# Patient Record
Sex: Male | Born: 1980
Health system: Southern US, Community
[De-identification: ages and names within clinical notes are randomized; demographics above are authoritative.]

## PROBLEM LIST (undated history)

## (undated) DIAGNOSIS — I10 Essential (primary) hypertension: Secondary | ICD-10-CM

## (undated) DIAGNOSIS — R030 Elevated blood-pressure reading, without diagnosis of hypertension: Secondary | ICD-10-CM

## (undated) DIAGNOSIS — E785 Hyperlipidemia, unspecified: Secondary | ICD-10-CM

## (undated) DIAGNOSIS — E782 Mixed hyperlipidemia: Secondary | ICD-10-CM

## (undated) DIAGNOSIS — G4733 Obstructive sleep apnea (adult) (pediatric): Secondary | ICD-10-CM

## (undated) DIAGNOSIS — F419 Anxiety disorder, unspecified: Secondary | ICD-10-CM

## (undated) DIAGNOSIS — F439 Reaction to severe stress, unspecified: Secondary | ICD-10-CM

## (undated) HISTORY — DX: Mixed hyperlipidemia: E78.2

## (undated) HISTORY — DX: Obstructive sleep apnea (adult) (pediatric): G47.33

## (undated) HISTORY — DX: Hyperlipidemia, unspecified: E78.5

## (undated) HISTORY — DX: Elevated blood-pressure reading, without diagnosis of hypertension: R03.0

## (undated) HISTORY — DX: Reaction to severe stress, unspecified: F43.9

## (undated) HISTORY — DX: Anxiety disorder, unspecified: F41.9

---

## 2016-08-20 ENCOUNTER — Other Ambulatory Visit: Payer: Self-pay | Admitting: Family Medicine

## 2016-08-20 ENCOUNTER — Ambulatory Visit (INDEPENDENT_AMBULATORY_CARE_PROVIDER_SITE_OTHER): Payer: BLUE CROSS/BLUE SHIELD | Admitting: Family Medicine

## 2016-08-20 ENCOUNTER — Encounter: Payer: Self-pay | Admitting: Family Medicine

## 2016-08-20 VITALS — BP 120/80 | HR 85 | Ht 67.0 in | Wt 227.4 lb

## 2016-08-20 DIAGNOSIS — Z113 Encounter for screening for infections with a predominantly sexual mode of transmission: Secondary | ICD-10-CM

## 2016-08-20 DIAGNOSIS — Z13220 Encounter for screening for lipoid disorders: Secondary | ICD-10-CM

## 2016-08-20 DIAGNOSIS — E669 Obesity, unspecified: Secondary | ICD-10-CM

## 2016-08-20 DIAGNOSIS — R5383 Other fatigue: Secondary | ICD-10-CM

## 2016-08-20 DIAGNOSIS — E782 Mixed hyperlipidemia: Secondary | ICD-10-CM | POA: Diagnosis not present

## 2016-08-20 DIAGNOSIS — Z0000 Encounter for general adult medical examination without abnormal findings: Secondary | ICD-10-CM | POA: Diagnosis not present

## 2016-08-20 DIAGNOSIS — G479 Sleep disorder, unspecified: Secondary | ICD-10-CM | POA: Diagnosis not present

## 2016-08-20 DIAGNOSIS — R7989 Other specified abnormal findings of blood chemistry: Secondary | ICD-10-CM | POA: Diagnosis not present

## 2016-08-20 LAB — COMPREHENSIVE METABOLIC PANEL
ALBUMIN: 4.7 g/dL (ref 3.6–5.1)
ALT: 54 U/L — ABNORMAL HIGH (ref 9–46)
AST: 38 U/L (ref 10–40)
Alkaline Phosphatase: 78 U/L (ref 40–115)
BUN: 17 mg/dL (ref 7–25)
CALCIUM: 9.4 mg/dL (ref 8.6–10.3)
CHLORIDE: 102 mmol/L (ref 98–110)
CO2: 22 mmol/L (ref 20–31)
Creat: 0.96 mg/dL (ref 0.60–1.35)
Glucose, Bld: 85 mg/dL (ref 65–99)
POTASSIUM: 4.3 mmol/L (ref 3.5–5.3)
Sodium: 137 mmol/L (ref 135–146)
TOTAL PROTEIN: 7.4 g/dL (ref 6.1–8.1)
Total Bilirubin: 0.8 mg/dL (ref 0.2–1.2)

## 2016-08-20 LAB — CBC WITH DIFFERENTIAL/PLATELET
BASOS ABS: 63 {cells}/uL (ref 0–200)
Basophils Relative: 1 %
EOS ABS: 63 {cells}/uL (ref 15–500)
EOS PCT: 1 %
HEMATOCRIT: 47 % (ref 38.5–50.0)
HEMOGLOBIN: 16.3 g/dL (ref 13.2–17.1)
LYMPHS ABS: 1638 {cells}/uL (ref 850–3900)
Lymphocytes Relative: 26 %
MCH: 30.9 pg (ref 27.0–33.0)
MCHC: 34.7 g/dL (ref 32.0–36.0)
MCV: 89 fL (ref 80.0–100.0)
MONO ABS: 441 {cells}/uL (ref 200–950)
MPV: 11.3 fL (ref 7.5–12.5)
Monocytes Relative: 7 %
NEUTROS PCT: 65 %
Neutro Abs: 4095 cells/uL (ref 1500–7800)
Platelets: 210 10*3/uL (ref 140–400)
RBC: 5.28 MIL/uL (ref 4.20–5.80)
RDW: 13.1 % (ref 11.0–15.0)
WBC: 6.3 10*3/uL (ref 4.0–10.5)

## 2016-08-20 LAB — POCT URINALYSIS DIPSTICK
Bilirubin, UA: NEGATIVE
Blood, UA: NEGATIVE
GLUCOSE UA: NEGATIVE
Ketones, UA: NEGATIVE
Leukocytes, UA: NEGATIVE
NITRITE UA: NEGATIVE
PROTEIN UA: NEGATIVE
Spec Grav, UA: >=1.03
UROBILINOGEN UA: NEGATIVE
pH, UA: 6

## 2016-08-20 LAB — LIPID PANEL
CHOL/HDL RATIO: 6.1 ratio — AB (ref <=5.0)
CHOLESTEROL: 219 mg/dL — AB (ref 125–200)
HDL: 36 mg/dL — AB (ref >=40)
LDL Cholesterol: 129 mg/dL (ref <130)
Triglycerides: 268 mg/dL — ABNORMAL HIGH (ref <150)
VLDL: 54 mg/dL — ABNORMAL HIGH (ref <30)

## 2016-08-20 LAB — TSH: TSH: 1.44 m[IU]/L (ref 0.40–4.50)

## 2016-08-20 NOTE — Progress Notes (Signed)
Subjective:    Patient ID: Zachary Schultz, male    DOB: 20-Jul-1981, 35 y.o.   MRN: [id number]  HPI Chief Complaint  Patient presents with  . new pt    new pt- fasting cpe- owes a company so tired, trouble sleeping- always on the go.    He is new to the practice and here for a complete physical exam. Previous medical care: Zachary Schultz in [place] for past 11-12 years. States they no longer take his insurance.  Last CPE: 3-4 years ago.  Complains today of fatigue and waking up at night gasping for air. States he snores especially after drinking alcohol. States he falls asleep during the day sometimes while sitting in his car for lunch or sitting down to watch tv.  Other providers: none  Past medical history: anxiety in past and took medication. Took medication for this for a short period. Denies feeling anxious but does reports high stress job.   Surgeries: none  Family history: father died- unknown health history other than . Mother living and good health.   Social history: Lives with wife and child age 115. He is the owner of [employer].  Smokes "when I drink" , drinks alcohol about 1 time "heavily"  Per week, 12-18 beers, occasional marijuana use.  Diet: [employer] program- for past 2 weeks and lost 6 lbs.  Exercise: started exercising 2 months ago.   Immunizations: Tdap 4 years ago. Declines flu shot.   Health maintenance:  Colonoscopy: N/A Last PSA: n/a Last Dental Exam: annually Last Eye Exam: within past year.   Wears seatbelt always, uses sunscreen, smoke detectors in home and functioning, does not text while driving, feels safe in home environment.  Reviewed allergies, medications, past medical, surgical, family, and social history.   Review of Systems Review of Systems Constitutional: -fever, -chills, -sweats, -unexpected weight change,+fatigue ENT: -runny nose, -ear pain, -sore throat Cardiology:  -chest pain, -palpitations,  -edema Respiratory: -cough, -shortness of breath, -wheezing Gastroenterology: -abdominal pain, -nausea, -vomiting, -diarrhea, -constipation  Hematology: -bleeding or bruising problems Musculoskeletal: -arthralgias, -myalgias, -joint swelling, -back pain Ophthalmology: -vision changes Urology: -dysuria, -difficulty urinating, -hematuria, -urinary frequency, -urgency Neurology: -headache, -weakness, -tingling, -numbness       Objective:   Physical Exam BP 120/80   Pulse 85   Ht 5\' 7"  (1.702 m)   Wt 227 lb 6.4 oz (103.1 kg)   BMI 35.62 kg/m   General Appearance:    Alert, cooperative, no distress, appears stated age  Head:    Normocephalic, without obvious abnormality, atraumatic  Eyes:    PERRL, conjunctiva/corneas clear, EOM's intact, fundi    benign  Ears:    Normal TM's and external ear canals  Nose:   Nares normal, mucosa normal, no drainage or sinus   tenderness  Throat:   Lips, mucosa, and tongue normal; teeth and gums normal  Neck:   Supple, no lymphadenopathy;  thyroid:  no   enlargement/tenderness/nodules; no carotid   bruit or JVD  Back:    Spine nontender, no curvature, ROM normal, no CVA     tenderness  Lungs:     Clear to auscultation bilaterally without wheezes, rales or     ronchi; respirations unlabored  Chest Wall:    No tenderness or deformity   Heart:    Regular rate and rhythm, S1 and S2 normal, no murmur, rub   or gallop  Breast Exam:    No chest wall tenderness, masses or gynecomastia  Abdomen:  Soft, non-tender, nondistended, normoactive bowel sounds,    no masses, no hepatosplenomegaly  Genitalia:    declined.  Rectal:   Deferred due to age <40 and lack of symptoms  Extremities:   No clubbing, cyanosis or edema  Pulses:   2+ and symmetric all extremities  Skin:   Skin color, texture, turgor normal, no rashes or lesions  Lymph nodes:   Cervical, supraclavicular, and axillary nodes normal  Neurologic:   CNII-XII intact, normal strength, sensation and  gait; reflexes 2+ and symmetric throughout          Psych:   Normal mood, affect, hygiene and grooming.    Urinalysis dipstick: negative Epworth sleep study: 14     Assessment & Plan:  Routine general medical examination at a health care facility - Plan: CBC with Differential/Platelet, Comprehensive metabolic panel, POCT urinalysis dipstick, Lipid panel  Sleep disturbance - Plan: Home sleep test  Other fatigue - Plan: TSH, VITAMIN D 25 Hydroxy (Vit-D Deficiency, Fractures)  Obesity (BMI 30-39.9)  Screening for lipid disorders - Plan: Lipid panel  Screening for STD (sexually transmitted disease) - Plan: HIV antibody, RPR, GC/Chlamydia Probe Amp  Discussed that I would like to refer him for a sleep study for possible sleep apnea. Epworth scale speaks to this.  Suspect fatigue may be related to sleep apnea. Plan to check labs to rule out underlying etiology.  Congratulated him on exercising and making healthy dietary changes. Cautioned him about short-term fad diets and encouraged him to think about healthy lifestyle modifications that he can sustain.  STD screening done per patient request.  Discussed stress management such as taking slow deep breaths, exercise, avoiding alcohol and other substances.  Advised that binge drinking is harmful to his health.  Review of care everywhere does show that he has a history of hyperlipidemia and elevated triglycerides in 2014. Patient did not mention this today. his Plan to get previous medical records and have him follow up in 3 weeks for fatigue, sleep issues.

## 2016-08-20 NOTE — Patient Instructions (Signed)
The sleep lab will call you to schedule and appointment. You may have sleep apnea but they will be able to do more testing to see.   Follow up in 3 weeks but make sure I have your previous medical records.  We will call you with lab results.   Preventative Care for Adults, Male       REGULAR HEALTH EXAMS:  A routine yearly physical is a good way to check in with your primary care provider about your health and preventive screening. It is also an opportunity to share updates about your health and any concerns you have, and receive a thorough all-over exam.   Most health insurance companies pay for at least some preventative services.  Check with your health plan for specific coverages.  WHAT PREVENTATIVE SERVICES DO MEN NEED?  Adult men should have their weight and blood pressure checked regularly.   Men age 35 and older should have their cholesterol levels checked regularly.  Beginning at age 35 and continuing to age 35, men should be screened for colorectal cancer.  Certain people should may need continued testing until age 35.  Other cancer screening may include exams for testicular and prostate cancer.  Updating vaccinations is part of preventative care.  Vaccinations help protect against diseases such as the flu.  Lab tests are generally done as part of preventative care to screen for anemia and blood disorders, to screen for problems with the kidneys and liver, to screen for bladder problems, to check blood sugar, and to check your cholesterol level.  Preventative services generally include counseling about diet, exercise, avoiding tobacco, drugs, excessive alcohol consumption, and sexually transmitted infections.    GENERAL RECOMMENDATIONS FOR GOOD HEALTH:  Healthy diet:  Eat a variety of foods, including fruit, vegetables, animal or vegetable protein, such as meat, fish, chicken, and eggs, or beans, lentils, tofu, and grains, such as rice.  Drink plenty of water  daily.  Decrease saturated fat in the diet, avoid lots of red meat, processed foods, sweets, fast foods, and fried foods.  Exercise:  Aerobic exercise helps maintain good heart health. At least 30-40 minutes of moderate-intensity exercise is recommended. For example, a brisk walk that increases your heart rate and breathing. This should be done on most days of the week.   Find a type of exercise or a variety of exercises that you enjoy so that it becomes a part of your daily life.  Examples are running, walking, swimming, water aerobics, and biking.  For motivation and support, explore group exercise such as aerobic class, spin class, Zumba, Yoga,or  martial arts, etc.    Set exercise goals for yourself, such as a certain weight goal, walk or run in a race such as a 5k walk/run.  Speak to your primary care provider about exercise goals.  Disease prevention:  If you smoke or chew tobacco, find out from your caregiver how to quit. It can literally save your life, no matter how long you have been a tobacco user. If you do not use tobacco, never begin.   Maintain a healthy diet and normal weight. Increased weight leads to problems with blood pressure and diabetes.   The Body Mass Index or BMI is a way of measuring how much of your body is fat. Having a BMI above 27 increases the risk of heart disease, diabetes, hypertension, stroke and other problems related to obesity. Your caregiver can help determine your BMI and based on it develop an exercise and dietary  program to help you achieve or maintain this important measurement at a healthful level.  High blood pressure causes heart and blood vessel problems.  Persistent high blood pressure should be treated with medicine if weight loss and exercise do not work.   Fat and cholesterol leaves deposits in your arteries that can block them. This causes heart disease and vessel disease elsewhere in your body.  If your cholesterol is found to be high, or if  you have heart disease or certain other medical conditions, then you may need to have your cholesterol monitored frequently and be treated with medication.   Ask if you should have a stress test if your history suggests this. A stress test is a test done on a treadmill that looks for heart disease. This test can find disease prior to there being a problem.  Avoid drinking alcohol in excess (more than two drinks per day).  Avoid use of street drugs. Do not share needles with anyone. Ask for professional help if you need assistance or instructions on stopping the use of alcohol, cigarettes, and/or drugs.  Brush your teeth twice a day with fluoride toothpaste, and floss once a day. Good oral hygiene prevents tooth decay and gum disease. The problems can be painful, unattractive, and can cause other health problems. Visit your dentist for a routine oral and dental check up and preventive care every 6-12 months.   Look at your skin regularly.  Use a mirror to look at your back. Notify your caregivers of changes in moles, especially if there are changes in shapes, colors, a size larger than a pencil eraser, an irregular border, or development of new moles.  Safety:  Use seatbelts 100% of the time, whether driving or as a passenger.  Use safety devices such as hearing protection if you work in environments with loud noise or significant background noise.  Use safety glasses when doing any work that could send debris in to the eyes.  Use a helmet if you ride a bike or motorcycle.  Use appropriate safety gear for contact sports.  Talk to your caregiver about gun safety.  Use sunscreen with a SPF (or skin protection factor) of 15 or greater.  Lighter skinned people are at a greater risk of skin cancer. Don't forget to also wear sunglasses in order to protect your eyes from too much damaging sunlight. Damaging sunlight can accelerate cataract formation.   Practice safe sex. Use condoms. Condoms are used for  birth control and to help reduce the spread of sexually transmitted infections (or STIs).  Some of the STIs are gonorrhea (the clap), chlamydia, syphilis, trichomonas, herpes, HPV (human papilloma virus) and HIV (human immunodeficiency virus) which causes AIDS. The herpes, HIV and HPV are viral illnesses that have no cure. These can result in disability, cancer and death.   Keep carbon monoxide and smoke detectors in your home functioning at all times. Change the batteries every 6 months or use a model that plugs into the wall.   Vaccinations:  Stay up to date with your tetanus shots and other required immunizations. You should have a booster for tetanus every 10 years. Be sure to get your flu shot every year, since 5%-20% of the [place] population comes down with the flu. The flu vaccine changes each year, so being vaccinated once is not enough. Get your shot in the fall, before the flu season peaks.   Other vaccines to consider:  Pneumococcal vaccine to protect against certain types of pneumonia.  This is normally recommended for adults age 35 or older.  However, adults younger than 35 years old with certain underlying conditions such as diabetes, heart or lung disease should also receive the vaccine.  Shingles vaccine to protect against Varicella Zoster if you are older than age 35, or younger than 35 years old with certain underlying illness.  Hepatitis A vaccine to protect against a form of infection of the liver by a virus acquired from food.  Hepatitis B vaccine to protect against a form of infection of the liver by a virus acquired from blood or body fluids, particularly if you work in health care.  If you plan to travel internationally, check with your local health department for specific vaccination recommendations.  Cancer Screening:  Most routine colon cancer screening begins at the age of 35. On a yearly basis, doctors may provide special easy to use take-home tests to check for hidden  blood in the stool. Sigmoidoscopy or colonoscopy can detect the earliest forms of colon cancer and is life saving. These tests use a small camera at the end of a tube to directly examine the colon. Speak to your caregiver about this at age 35, when routine screening begins (and is repeated every 5 years unless early forms of pre-cancerous polyps or small growths are found).   At the age of 35 men usually start screening for prostate cancer every year. Screening may begin at a younger age for those with higher risk. Those at higher risk include African-Americans or having a family history of prostate cancer. There are two types of tests for prostate cancer:   Prostate-specific antigen (PSA) testing. Recent studies raise questions about prostate cancer using PSA and you should discuss this with your caregiver.   Digital rectal exam (in which your doctor's lubricated and gloved finger feels for enlargement of the prostate through the anus).   Screening for testicular cancer.  Do a monthly exam of your testicles. Gently roll each testicle between your thumb and fingers, feeling for any abnormal lumps. The best time to do this is after a hot shower or bath when the tissues are looser. Notify your caregivers of any lumps, tenderness or changes in size or shape immediately.

## 2016-08-21 LAB — GC/CHLAMYDIA PROBE AMP
CT Probe RNA: NOT DETECTED
GC Probe RNA: NOT DETECTED

## 2016-08-21 LAB — RPR

## 2016-08-21 LAB — VITAMIN D 25 HYDROXY (VIT D DEFICIENCY, FRACTURES): Vit D, 25-Hydroxy: 32 ng/mL (ref 30–100)

## 2016-08-21 LAB — HIV ANTIBODY (ROUTINE TESTING W REFLEX): HIV: NONREACTIVE

## 2016-08-22 LAB — HEPATITIS PANEL, ACUTE
HCV Ab: NEGATIVE
HEP A IGM: NONREACTIVE
HEP B S AG: NEGATIVE
Hep B C IgM: NONREACTIVE

## 2016-08-22 LAB — TESTOSTERONE: TESTOSTERONE: 541 ng/dL (ref 250–827)

## 2016-09-02 ENCOUNTER — Telehealth: Payer: Self-pay

## 2016-09-02 NOTE — Telephone Encounter (Signed)
Placed in your folder for review. 

## 2016-09-05 ENCOUNTER — Telehealth: Payer: Self-pay | Admitting: Family Medicine

## 2016-09-05 NOTE — Telephone Encounter (Signed)
Zachary RiegerLaura, Please call and check on his sleep study. He meets criteria as far as I'm concerned and the documentation is in my note regarding his symptoms. He wakes up at night gasping for air and his wife has observed him stop breathing, he snores, has fatigue during the day and falls asleep at random times when he sits down. His epworth sleep scale is 14. Please help me get this approved. Thanks.  Aralyn Nowak

## 2016-09-10 ENCOUNTER — Ambulatory Visit: Payer: BLUE CROSS/BLUE SHIELD | Admitting: Family Medicine

## 2016-09-12 ENCOUNTER — Encounter: Payer: Self-pay | Admitting: Family Medicine

## 2016-09-12 ENCOUNTER — Telehealth: Payer: Self-pay | Admitting: Family Medicine

## 2016-09-12 NOTE — Telephone Encounter (Signed)
Appeal letter typed & faxed for sleep study

## 2016-09-17 ENCOUNTER — Encounter: Payer: Self-pay | Admitting: Family Medicine

## 2016-09-17 ENCOUNTER — Ambulatory Visit (INDEPENDENT_AMBULATORY_CARE_PROVIDER_SITE_OTHER): Payer: BLUE CROSS/BLUE SHIELD | Admitting: Family Medicine

## 2016-09-17 DIAGNOSIS — E782 Mixed hyperlipidemia: Secondary | ICD-10-CM | POA: Diagnosis not present

## 2016-09-17 DIAGNOSIS — R740 Nonspecific elevation of levels of transaminase and lactic acid dehydrogenase [LDH]: Secondary | ICD-10-CM | POA: Diagnosis not present

## 2016-09-17 DIAGNOSIS — G479 Sleep disorder, unspecified: Secondary | ICD-10-CM | POA: Insufficient documentation

## 2016-09-17 DIAGNOSIS — R7401 Elevation of levels of liver transaminase levels: Secondary | ICD-10-CM

## 2016-09-17 NOTE — Progress Notes (Signed)
   Subjective:    Patient ID: Zachary Schultz, male    DOB: 1981-02-26, 35 y.o.   MRN: [id number]  HPI Chief Complaint  Patient presents with  . 2 week follow-up    2 week follow-up. feeling better- not sleeping well, stopped drinking alot, and smoking   He is here for follow up on sleep issues, fatigue, elevated liver enzymes and binge drinking. He also had abnormal cholesterol values at his previous visit.  States he has been watching his diet, exercising and has lost some weight. States he had a conference in [place] last week and went off his diet and drank alcohol but he is back to healthy lifestyle this week.  He is optimistic that he can make positive healthy lifestyle changes.  Sates he is doing cardio with a trainer, jogging 1.5 miles 3 days per week. Is doing weight training also. States he feels much better since making these changes. Has more energy.  He has also cut back on smoking since he is not drinking as often.   He is still concerned regarding sleep issues, he states he is full of energy and wants to slow down but has a hard time shutting his brain down at night. States he also wakes up in the middle of night thinking of things for work. States he has still been snoring but has not been waking up gasping for air or with his heart racing like he was in the past. He was not approved to have a sleep study and our office is still in the process of getting this approved as he does meet criteria for this.    He denies fever, chills, headache, dizziness, chest pain, palpitations, DOE, GI or GU symptoms.  Reviewed allergies, medications, past medical, and social history.  Past Medical History:  Diagnosis Date  . Elevated triglycerides with high cholesterol    2014  . Hyperlipidemia    Reviewed allergies, medications, past medical, and social history.   Review of Systems Pertinent positives and negatives in the history of present illness.     Objective:   Physical Exam BP  130/84   Pulse 80   Wt 223 lb 3.2 oz (101.2 kg)   BMI 34.96 kg/m   Alert and oriented and in no acute distress. Not otherwise examined.      Assessment & Plan:  Sleep disorder  Mixed hyperlipidemia - Plan: Lipid panel  Elevated ALT measurement - Plan: Comprehensive metabolic panel  He is pleasant, has a very positive outlook on life and is very energetic. Recommend that he try melatonin for now. Sleep study is in the process of being approved.  He will return prior to Thanksgiving for fasting lipids and CMP for elevated liver enzymes. Orders are in the computer.  Recommend that he follow-up regarding sleep once he has had a sleep study and let me know if the melatonin is helping. Congratulated him on making healthy lifestyle changes and encouraged him to continue with healthy diet and exercise. He has had a 4 pound weight loss and is pleased about this. He has also significantly reduced his alcohol intake. Advised him to stop drinking and smoking in order to prevent chronic health conditions such as liver disease, heart disease.  He may need to start on cholesterol treatment after we repeat his fasting lipids in November.

## 2016-09-17 NOTE — Patient Instructions (Signed)
Try taking Melatonin ([place] is a good brand). You can buy the rapid dissolving or the tablets. Start with 3 mg and see how this helps. Take this 20-30 minutes before you want to go to sleep.  Continue with healthy diet and exercise. Congratulations on making this lifestyle change.  Return at the end of November or early December for repeat fasting cholesterol and liver enzymes.

## 2016-10-14 ENCOUNTER — Encounter (HOSPITAL_BASED_OUTPATIENT_CLINIC_OR_DEPARTMENT_OTHER): Payer: Self-pay

## 2016-10-14 ENCOUNTER — Other Ambulatory Visit: Payer: BLUE CROSS/BLUE SHIELD

## 2016-11-26 ENCOUNTER — Encounter: Payer: Self-pay | Admitting: Medical

## 2016-11-26 ENCOUNTER — Other Ambulatory Visit: Payer: Self-pay | Admitting: Medical

## 2016-11-26 ENCOUNTER — Ambulatory Visit (INDEPENDENT_AMBULATORY_CARE_PROVIDER_SITE_OTHER): Payer: BLUE CROSS/BLUE SHIELD | Admitting: Medical

## 2016-11-26 VITALS — BP 144/80 | HR 91 | Wt 231.2 lb

## 2016-11-26 DIAGNOSIS — L918 Other hypertrophic disorders of the skin: Secondary | ICD-10-CM

## 2016-11-26 DIAGNOSIS — L989 Disorder of the skin and subcutaneous tissue, unspecified: Secondary | ICD-10-CM | POA: Diagnosis not present

## 2016-11-26 NOTE — Progress Notes (Signed)
Subjective: Chief Complaint  Patient presents with  . skin tag   Here for skin lesions he wants removed.  They are embarrassing, worried about them.   Has large one on left upper arm, but some in both axilla, several around the eye lids.   The left arm one has grown but the rest are unchanged, been there a while.   No other aggravating or relieving factors. No other complaint.  Past Medical History:  Diagnosis Date  . Elevated triglycerides with high cholesterol    2014  . Hyperlipidemia    No current outpatient prescriptions on file prior to visit.   No current facility-administered medications on file prior to visit.    ROS as in subjective   Objective BP (!) 144/80   Pulse 91   Wt 231 lb 3.2 oz (104.9 kg)   SpO2 97%   BMI 36.21 kg/m   Gen: wd, wn, nad Skin:  Lesion 1, 2, 3 - group of pedunculated flesh colored benign skin tags each about 3mm diameter of right axilla  Lesion 4 - single pedunculated 3mm diameter skin tag of left axilla  Lesion 5 - larger 4mm x 3mm fleshy but somewhat brown lesion of left upper arm, possible skin tag vs other  Lesion 6 - right face, below eyelid with 1mm diameter raised skin tag  Lesion 7 - right face, upper eyelid with 1mm diameter raised skin tag  Lesion 8 - left face, inferior to eyelid with 2mm diameter raised skin tag    Assessment: Encounter Diagnoses  Name Primary?  . Changing skin lesion Yes  . Skin tag     Plan: Discussed options for therapy, and he wishes to have all 8 lesions removed/excised today.   discussed risks/benefits of procedure, after care, benign nature of the skin tags, the one lesion will be sent to pathology.  Discussed possible insurance billing vs cash pay.  Cleaned and prepped the arm and axilla lesions in usual sterile fashion.  Used ethyl acetate spray for local anesthesia.  Removed lesions 1,2,3,4,5 with sterile scissors.  Lesions were benign skin lesions except the left anterior upper arm lesion  (lesion 5), sent for pathology.  Cleaned and prepped the facial lesions/eye lid lesions in usual sterile fashion.  Used 1% xylocaine without epi for local anesthesia.  Removed lesions forceps and scalpel carefully.   Lesions 6,7,8.  Lesions were benign skin lesions.   Achieved hemostasis with direct pressure.

## 2016-11-26 NOTE — Patient Instructions (Signed)
Skin Tag, Adult A skin tag (acrochordon) is a soft, extra growth of skin. Most skin tags are flesh-colored and rarely bigger than a pencil eraser. They commonly form near areas where there are folds in the skin, such as the armpit or groin. Skin tags are not dangerous, and they do not spread from person to person (are not contagious). You may have one skin tag or several. Skin tags do not require treatment. However, your health care provider may recommend removal of a skin tag if it:  Gets irritated from clothing.  Bleeds.  Is visible and unsightly. Your health care provider can remove skin tags with a simple surgical procedure or a procedure that involves freezing the skin tag. Follow these instructions at home:  Watch for any changes in your skin tag. A normal skin tag does not require any other special care at home.  Take over-the-counter and prescription medicines only as told by your health care provider.  Keep all follow-up visits as told by your health care provider. This is important. Contact a health care provider if:  You have a skin tag that:  Becomes painful.  Changes color.  Bleeds.  Swells.  You develop more skin tags. This information is not intended to replace advice given to you by your health care provider. Make sure you discuss any questions you have with your health care provider. Document Released: 11/25/2015 Document Revised: 07/06/2016 Document Reviewed: 11/25/2015 [employer] Interactive Patient Education  2017 [employer]  

## 2018-04-22 ENCOUNTER — Encounter: Payer: Self-pay | Admitting: Family Medicine

## 2018-04-22 ENCOUNTER — Ambulatory Visit: Payer: BLUE CROSS/BLUE SHIELD | Admitting: Family Medicine

## 2018-04-22 VITALS — BP 136/94 | HR 84 | Temp 98.4°F | Resp 20 | Ht 66.5 in | Wt 239.8 lb

## 2018-04-22 DIAGNOSIS — R631 Polydipsia: Secondary | ICD-10-CM

## 2018-04-22 DIAGNOSIS — R0981 Nasal congestion: Secondary | ICD-10-CM

## 2018-04-22 DIAGNOSIS — R030 Elevated blood-pressure reading, without diagnosis of hypertension: Secondary | ICD-10-CM

## 2018-04-22 DIAGNOSIS — R740 Nonspecific elevation of levels of transaminase and lactic acid dehydrogenase [LDH]: Secondary | ICD-10-CM

## 2018-04-22 DIAGNOSIS — R5383 Other fatigue: Secondary | ICD-10-CM | POA: Diagnosis not present

## 2018-04-22 DIAGNOSIS — Z9189 Other specified personal risk factors, not elsewhere classified: Secondary | ICD-10-CM | POA: Insufficient documentation

## 2018-04-22 DIAGNOSIS — G4719 Other hypersomnia: Secondary | ICD-10-CM

## 2018-04-22 DIAGNOSIS — E669 Obesity, unspecified: Secondary | ICD-10-CM | POA: Insufficient documentation

## 2018-04-22 DIAGNOSIS — K219 Gastro-esophageal reflux disease without esophagitis: Secondary | ICD-10-CM | POA: Diagnosis not present

## 2018-04-22 DIAGNOSIS — E782 Mixed hyperlipidemia: Secondary | ICD-10-CM

## 2018-04-22 DIAGNOSIS — R7401 Elevation of levels of liver transaminase levels: Secondary | ICD-10-CM

## 2018-04-22 DIAGNOSIS — J3489 Other specified disorders of nose and nasal sinuses: Secondary | ICD-10-CM

## 2018-04-22 DIAGNOSIS — J309 Allergic rhinitis, unspecified: Secondary | ICD-10-CM | POA: Diagnosis not present

## 2018-04-22 DIAGNOSIS — Z833 Family history of diabetes mellitus: Secondary | ICD-10-CM | POA: Insufficient documentation

## 2018-04-22 HISTORY — DX: Elevated blood-pressure reading, without diagnosis of hypertension: R03.0

## 2018-04-22 LAB — POCT URINALYSIS DIP (PROADVANTAGE DEVICE)
BILIRUBIN UA: NEGATIVE mg/dL
Bilirubin, UA: NEGATIVE
GLUCOSE UA: NEGATIVE mg/dL
Leukocytes, UA: NEGATIVE
Nitrite, UA: NEGATIVE
PROTEIN UA: NEGATIVE mg/dL
RBC UA: NEGATIVE
Specific Gravity, Urine: 1.03
Urobilinogen, Ur: NEGATIVE
pH, UA: 6 (ref 5.0–8.0)

## 2018-04-22 LAB — POCT GLYCOSYLATED HEMOGLOBIN (HGB A1C): HEMOGLOBIN A1C: 5.7 % — AB (ref 4.0–5.6)

## 2018-04-22 NOTE — Patient Instructions (Addendum)
Your blood test shows that you have prediabetes. This increases your risk of developing diabetes. Cut back on sugar, sweets and carbohydrates such as bread, pasta, rice, and potatoes.   Your blood pressure is elevated today. I recommend that you get a blood pressure cuff and check this at home a few times per week. Goal BP is less than 130/80.  Cut back on salty foods.   You may want to try taking a non drowsy antihistamine such as Claritin, Allegra, Zyrtec or Xyzal to see if this helps with the left sided facial symptoms and nasal congestion.   I am ordering a sleep study to see if you have sleep apnea. They should contact you about this.   You may want to try taking an acid reducer for acid reflux such as Zantac. If this does not help then you can try omeprazole or pantoprazole.   Cut back on alcohol.    DASH Eating Plan DASH stands for "Dietary Approaches to Stop Hypertension." The DASH eating plan is a healthy eating plan that has been shown to reduce high blood pressure (hypertension). It may also reduce your risk for type 2 diabetes, heart disease, and stroke. The DASH eating plan may also help with weight loss. What are tips for following this plan? General guidelines  Avoid eating more than 2,300 mg (milligrams) of salt (sodium) a day. If you have hypertension, you may need to reduce your sodium intake to 1,500 mg a day.  Limit alcohol intake to no more than 1 drink a day for nonpregnant women and 2 drinks a day for men. One drink equals 12 oz of beer, 5 oz of wine, or 1 oz of hard liquor.  Work with your health care provider to maintain a healthy body weight or to lose weight. Ask what an ideal weight is for you.  Get at least 30 minutes of exercise that causes your heart to beat faster (aerobic exercise) most days of the week. Activities may include walking, swimming, or biking.  Work with your health care provider or diet and nutrition specialist (dietitian) to adjust your  eating plan to your individual calorie needs. Reading food labels  Check food labels for the amount of sodium per serving. Choose foods with less than 5 percent of the Daily Value of sodium. Generally, foods with less than 300 mg of sodium per serving fit into this eating plan.  To find whole grains, look for the word "whole" as the first word in the ingredient list. Shopping  Buy products labeled as "low-sodium" or "no salt added."  Buy fresh foods. Avoid canned foods and premade or frozen meals. Cooking  Avoid adding salt when cooking. Use salt-free seasonings or herbs instead of table salt or sea salt. Check with your health care provider or pharmacist before using salt substitutes.  Do not fry foods. Cook foods using healthy methods such as baking, boiling, grilling, and broiling instead.  Cook with heart-healthy oils, such as olive, canola, soybean, or sunflower oil. Meal planning   Eat a balanced diet that includes: ? 5 or more servings of fruits and vegetables each day. At each meal, try to fill half of your plate with fruits and vegetables. ? Up to 6-8 servings of whole grains each day. ? Less than 6 oz of lean meat, poultry, or fish each day. A 3-oz serving of meat is about the same size as a deck of cards. One egg equals 1 oz. ? 2 servings of low-fat dairy  each day. ? A serving of nuts, seeds, or beans 5 times each week. ? Heart-healthy fats. Healthy fats called Omega-3 fatty acids are found in foods such as flaxseeds and coldwater fish, like sardines, salmon, and mackerel.  Limit how much you eat of the following: ? Canned or prepackaged foods. ? Food that is high in trans fat, such as fried foods. ? Food that is high in saturated fat, such as fatty meat. ? Sweets, desserts, sugary drinks, and other foods with added sugar. ? Full-fat dairy products.  Do not salt foods before eating.  Try to eat at least 2 vegetarian meals each week.  Eat more home-cooked food and  less restaurant, buffet, and fast food.  When eating at a restaurant, ask that your food be prepared with less salt or no salt, if possible. What foods are recommended? The items listed may not be a complete list. Talk with your dietitian about what dietary choices are best for you. Grains Whole-grain or whole-wheat bread. Whole-grain or whole-wheat pasta. Brown rice. Orpah Cobb. Bulgur. Whole-grain and low-sodium cereals. Pita bread. Low-fat, low-sodium crackers. Whole-wheat flour tortillas. Vegetables Fresh or frozen vegetables (raw, steamed, roasted, or grilled). Low-sodium or reduced-sodium tomato and vegetable juice. Low-sodium or reduced-sodium tomato sauce and tomato paste. Low-sodium or reduced-sodium canned vegetables. Fruits All fresh, dried, or frozen fruit. Canned fruit in natural juice (without added sugar). Meat and other protein foods Skinless chicken or [place]. Ground chicken or [place]. Pork with fat trimmed off. Fish and seafood. Egg whites. Dried beans, peas, or lentils. Unsalted nuts, nut butters, and seeds. Unsalted canned beans. Lean cuts of beef with fat trimmed off. Low-sodium, lean deli meat. Dairy Low-fat (1%) or fat-free (skim) milk. Fat-free, low-fat, or reduced-fat cheeses. Nonfat, low-sodium ricotta or cottage cheese. Low-fat or nonfat yogurt. Low-fat, low-sodium cheese. Fats and oils Soft margarine without trans fats. Vegetable oil. Low-fat, reduced-fat, or light mayonnaise and salad dressings (reduced-sodium). Canola, safflower, olive, soybean, and sunflower oils. Avocado. Seasoning and other foods Herbs. Spices. Seasoning mixes without salt. Unsalted popcorn and pretzels. Fat-free sweets. What foods are not recommended? The items listed may not be a complete list. Talk with your dietitian about what dietary choices are best for you. Grains Baked goods made with fat, such as croissants, muffins, or some breads. Dry pasta or rice meal  packs. Vegetables Creamed or fried vegetables. Vegetables in a cheese sauce. Regular canned vegetables (not low-sodium or reduced-sodium). Regular canned tomato sauce and paste (not low-sodium or reduced-sodium). Regular tomato and vegetable juice (not low-sodium or reduced-sodium). Rosita Fire. Olives. Fruits Canned fruit in a light or heavy syrup. Fried fruit. Fruit in cream or butter sauce. Meat and other protein foods Fatty cuts of meat. Ribs. Fried meat. Tomasa Blase. Sausage. Bologna and other processed lunch meats. Salami. Fatback. Hotdogs. Bratwurst. Salted nuts and seeds. Canned beans with added salt. Canned or smoked fish. Whole eggs or egg yolks. Chicken or [place] with skin. Dairy Whole or 2% milk, cream, and half-and-half. Whole or full-fat cream cheese. Whole-fat or sweetened yogurt. Full-fat cheese. Nondairy creamers. Whipped toppings. Processed cheese and cheese spreads. Fats and oils Butter. Stick margarine. Lard. Shortening. Ghee. Bacon fat. Tropical oils, such as coconut, palm kernel, or palm oil. Seasoning and other foods Salted popcorn and pretzels. Onion salt, garlic salt, seasoned salt, table salt, and sea salt. Worcestershire sauce. Tartar sauce. Barbecue sauce. Teriyaki sauce. Soy sauce, including reduced-sodium. Steak sauce. Canned and packaged gravies. Fish sauce. Oyster sauce. Cocktail sauce. Horseradish that you find on  the shelf. Ketchup. Mustard. Meat flavorings and tenderizers. Bouillon cubes. Hot sauce and Tabasco sauce. Premade or packaged marinades. Premade or packaged taco seasonings. Relishes. Regular salad dressings. Where to find more information:  National Heart, Lung, and Blood Institute: PopSteam.is  American Heart Association: www.heart.org Summary  The DASH eating plan is a healthy eating plan that has been shown to reduce high blood pressure (hypertension). It may also reduce your risk for type 2 diabetes, heart disease, and stroke.  With the DASH eating  plan, you should limit salt (sodium) intake to 2,300 mg a day. If you have hypertension, you may need to reduce your sodium intake to 1,500 mg a day.  When on the DASH eating plan, aim to eat more fresh fruits and vegetables, whole grains, lean proteins, low-fat dairy, and heart-healthy fats.  Work with your health care provider or diet and nutrition specialist (dietitian) to adjust your eating plan to your individual calorie needs. This information is not intended to replace advice given to you by your health care provider. Make sure you discuss any questions you have with your health care provider. Document Released: 10/30/2011 Document Revised: 11/03/2016 Document Reviewed: 11/03/2016 Elsevier Interactive Patient Education  [employer].

## 2018-04-22 NOTE — Progress Notes (Signed)
Subjective:    Patient ID: Zachary Schultz, male    DOB: Jan 30, 1981, 37 y.o.   MRN: [id number]  HPI Chief Complaint  Patient presents with  . Consult    feels like water is dripping down in his brain on the left side as well as it feels like he wants to sneeze. He says he has a lot of stress at work. Has increased his drinking. Is very tired. His wife is going to night scholl so he feels like he doesn't want to stress her. Having issues sleeping. Having cravings for sweet things and then salty.    He is here with multiple complaints.   2 year intermittent history of nasal congestion, watery eyes, purulent nasal drainage on left, left frontal headache. States symptoms occur around the same time each year. Started approximately 1 week ago. He has not tried anything for his symptoms.   Denies fever, chills, dizziness, chest pain, palpitations, shortness of breath, abdominal pain, N/V/D, urinary symptoms, LE edema.    Reports feeling worsening fatigue and is alwasys sleepy. States he sits down and falls asleep.  Also reports increased thirst.  States he was unable to have a sleep study 2 years ago when I ordered this. He cannot recall but he thinks his insurance denied this test.   Obesity- gained weight. States he stopped exercising. Eating more sweets and drinks soda sometimes.  History of prediabetes and family history of diabetes.   Reports having issues with acid reflux especially after eating salsa and spicy foods. He does not plan to stop eating these.   History of elevated ALT. Alcohol intake has increased. He is now drinking 18-24 beers on the weekends. He is drinking tequila also.  Hyperlipidemia- mixed. He is fasting today and would like to check this.   Denies history of HTN. He is aware that his BP is elevated today. Has not been checking it elsewhere.    Reviewed allergies, medications, past medical, surgical, family, and social history.    Review of Systems Pertinent  positives and negatives in the history of present illness.     Objective:   Physical Exam BP (!) 136/94 (Cuff Size: Normal)   Pulse 84   Temp 98.4 F (36.9 C) (Tympanic)   Resp 20   Ht 5' 6.5" (1.689 m)   Wt 239 lb 12.8 oz (108.8 kg)   BMI 38.12 kg/m        Assessment & Plan:  Fatigue, unspecified type - Plan: HgB A1c, Home sleep test, POCT Urinalysis DIP (Proadvantage Device), CBC with Differential/Platelet, Comprehensive metabolic panel, TSH, VITAMIN D 25 Hydroxy (Vit-D Deficiency, Fractures), Testosterone, Vitamin B12  Nasal congestion with rhinorrhea  Allergic rhinitis, unspecified seasonality, unspecified trigger  Excessive daytime sleepiness - Plan: Home sleep test  At risk for sleep apnea - Plan: Home sleep test  Elevated ALT measurement  Mixed hyperlipidemia - Plan: Lipid panel  Increased thirst - Plan: HgB A1c, POCT Urinalysis DIP (Proadvantage Device)  Gastroesophageal reflux disease, esophagitis presence not specified  Family history of diabetes mellitus (DM) - Plan: HgB A1c, POCT Urinalysis DIP (Proadvantage Device)  Obesity (BMI 30-39.9) - Plan: HgB A1c, TSH  Elevated blood pressure reading without diagnosis of hypertension - Plan: Home sleep test  Hemoglobin A1c 5.7%. He is aware that he has prediabetes. States he was told this 6 years ago as well. This increases his risk of developing diabetes. Cut back on sugar, sweets and carbohydrates such as bread, pasta, rice, and potatoes.  Denies history of HTN. Will have him make healthy changes. DASH diet handout given.  Recommend he check his BP outside of here and return in 2 weeks since he is leaving the country [place] a [place] days.   Discussed multiple etiologies for fatigue. Suspicious that he has sleep apnea and will order a sleep study. It is unclear as to why this was not approved in 2017.  Discussed multiple risk factors for heart disease and recommended how to improve lifestyle.   He may want to try  taking a non drowsy antihistamine such as Claritin, Allegra, Zyrtec or Xyzal to see if this helps with the left sided facial symptoms and nasal congestion.   He may try taking an acid reducer for acid reflux such as Zantac. If this does not help try omeprazole or pantoprazole.   Cut back on alcohol. Recheck liver enzymes. Discussed that alcohol can increase risk of liver disease.   Check fasting labs today.

## 2018-04-23 LAB — COMPREHENSIVE METABOLIC PANEL
ALBUMIN: 5 g/dL (ref 3.5–5.5)
ALT: 54 IU/L — ABNORMAL HIGH (ref 0–44)
AST: 38 IU/L (ref 0–40)
Albumin/Globulin Ratio: 1.8 (ref 1.2–2.2)
Alkaline Phosphatase: 88 IU/L (ref 39–117)
BUN / CREAT RATIO: 18 (ref 9–20)
BUN: 17 mg/dL (ref 6–20)
Bilirubin Total: 0.7 mg/dL (ref 0.0–1.2)
CALCIUM: 10 mg/dL (ref 8.7–10.2)
CO2: 22 mmol/L (ref 20–29)
CREATININE: 0.95 mg/dL (ref 0.76–1.27)
Chloride: 103 mmol/L (ref 96–106)
GFR, EST AFRICAN AMERICAN: 119 mL/min/{1.73_m2} (ref >59)
GFR, EST NON AFRICAN AMERICAN: 103 mL/min/{1.73_m2} (ref >59)
GLOBULIN, TOTAL: 2.8 g/dL (ref 1.5–4.5)
Glucose: 94 mg/dL (ref 65–99)
Potassium: 5 mmol/L (ref 3.5–5.2)
SODIUM: 141 mmol/L (ref 134–144)
TOTAL PROTEIN: 7.8 g/dL (ref 6.0–8.5)

## 2018-04-23 LAB — CBC WITH DIFFERENTIAL/PLATELET
Basophils Absolute: 0 10*3/uL (ref 0.0–0.2)
Basos: 0 %
EOS (ABSOLUTE): 0.1 10*3/uL (ref 0.0–0.4)
EOS: 1 %
HEMATOCRIT: 45.5 % (ref 37.5–51.0)
Hemoglobin: 15.8 g/dL (ref 13.0–17.7)
IMMATURE GRANS (ABS): 0 10*3/uL (ref 0.0–0.1)
IMMATURE GRANULOCYTES: 0 %
Lymphocytes Absolute: 2.1 10*3/uL (ref 0.7–3.1)
Lymphs: 25 %
MCH: 30.7 pg (ref 26.6–33.0)
MCHC: 34.7 g/dL (ref 31.5–35.7)
MCV: 89 fL (ref 79–97)
MONOCYTES: 6 %
Monocytes Absolute: 0.5 10*3/uL (ref 0.1–0.9)
NEUTROS PCT: 68 %
Neutrophils Absolute: 5.7 10*3/uL (ref 1.4–7.0)
Platelets: 208 10*3/uL (ref 150–450)
RBC: 5.14 x10E6/uL (ref 4.14–5.80)
RDW: 13.5 % (ref 12.3–15.4)
WBC: 8.5 10*3/uL (ref 3.4–10.8)

## 2018-04-23 LAB — LIPID PANEL
CHOL/HDL RATIO: 6 ratio — AB (ref 0.0–5.0)
Cholesterol, Total: 248 mg/dL — ABNORMAL HIGH (ref 100–199)
HDL: 41 mg/dL (ref >39)
LDL CALC: 157 mg/dL — AB (ref 0–99)
TRIGLYCERIDES: 251 mg/dL — AB (ref 0–149)
VLDL Cholesterol Cal: 50 mg/dL — ABNORMAL HIGH (ref 5–40)

## 2018-04-23 LAB — TESTOSTERONE: Testosterone: 419 ng/dL (ref 264–916)

## 2018-04-23 LAB — VITAMIN B12: Vitamin B-12: 708 pg/mL (ref 232–1245)

## 2018-04-23 LAB — TSH: TSH: 2.51 u[IU]/mL (ref 0.450–4.500)

## 2018-04-23 LAB — VITAMIN D 25 HYDROXY (VIT D DEFICIENCY, FRACTURES): Vit D, 25-Hydroxy: 33.7 ng/mL (ref 30.0–100.0)

## 2018-05-03 ENCOUNTER — Ambulatory Visit: Payer: BLUE CROSS/BLUE SHIELD | Admitting: Family Medicine

## 2018-05-04 ENCOUNTER — Telehealth: Payer: Self-pay | Admitting: Internal Medicine

## 2018-05-04 NOTE — Telephone Encounter (Signed)
I called the number you gave and had to leave a voice mail with my call back information. Is there a different number for peer to peer call to speak with someone?

## 2018-05-04 NOTE — Telephone Encounter (Signed)
Vickie, I tried to call to see if I could help with peer to peer and this call can only be made by you.    (we ordered a sleep study last year but pt has new insurance so it was denied as they did not have enough documentation.)   Per your notes-   He wakes up at night gasping for air and his wife has observed him stop breathing, he snores, has fatigue during the day and falls asleep at random times when he sits down. His epworth sleep scale is 14.

## 2018-05-04 NOTE — Telephone Encounter (Signed)
No, I called the number listed and had to hit option for sleep medicine therapy.

## 2018-05-04 NOTE — Telephone Encounter (Signed)
[facility] called and states that they need a peer to peer by the provider for pt to get approved for home sleep test. His insurance will not pay for a split night so they need additional information to get the home sleep test approved. They need to be contacted within the next 24 hours for approval.   [employer] ID- [id number] CPT- [phone] Phone number to call- [phone]    Terri at [facility] wants a call back with approval info to process this. [phone]

## 2018-05-05 ENCOUNTER — Ambulatory Visit: Payer: BLUE CROSS/BLUE SHIELD | Admitting: Family Medicine

## 2018-05-05 ENCOUNTER — Encounter: Payer: Self-pay | Admitting: Family Medicine

## 2018-05-05 VITALS — BP 130/100 | HR 80 | Resp 16 | Ht 67.5 in | Wt 241.4 lb

## 2018-05-05 DIAGNOSIS — R002 Palpitations: Secondary | ICD-10-CM | POA: Diagnosis not present

## 2018-05-05 DIAGNOSIS — F419 Anxiety disorder, unspecified: Secondary | ICD-10-CM | POA: Diagnosis not present

## 2018-05-05 DIAGNOSIS — R079 Chest pain, unspecified: Secondary | ICD-10-CM | POA: Diagnosis not present

## 2018-05-05 DIAGNOSIS — Z9189 Other specified personal risk factors, not elsewhere classified: Secondary | ICD-10-CM

## 2018-05-05 DIAGNOSIS — R7303 Prediabetes: Secondary | ICD-10-CM | POA: Insufficient documentation

## 2018-05-05 DIAGNOSIS — R030 Elevated blood-pressure reading, without diagnosis of hypertension: Secondary | ICD-10-CM | POA: Diagnosis not present

## 2018-05-05 DIAGNOSIS — R0681 Apnea, not elsewhere classified: Secondary | ICD-10-CM | POA: Insufficient documentation

## 2018-05-05 DIAGNOSIS — F1010 Alcohol abuse, uncomplicated: Secondary | ICD-10-CM | POA: Diagnosis not present

## 2018-05-05 DIAGNOSIS — E782 Mixed hyperlipidemia: Secondary | ICD-10-CM

## 2018-05-05 MED ORDER — LISINOPRIL 10 MG PO TABS: 10.0000 mg | ORAL_TABLET | Freq: Every day | ORAL | 1 refills | Status: DC

## 2018-05-05 MED ORDER — SERTRALINE HCL 50 MG PO TABS
50.0000 mg | ORAL_TABLET | Freq: Every day | ORAL | 1 refills | Status: DC
Start: 2018-05-05 — End: 2018-11-09

## 2018-05-05 NOTE — Telephone Encounter (Signed)
Let's try to call them again this morning in between patients please. Not sure if I have to schedule a call back time with them but they did not call me back yesterday.

## 2018-05-05 NOTE — Progress Notes (Signed)
Subjective:    Patient ID: Zachary Schultz, male    DOB: April 30, 1981, 37 y.o.   MRN: [id number]  HPI Chief Complaint  Patient presents with  . BP Check    patient here for BP check    He is here to follow up on recently elevated BP without a history of HTN. He has not been checking his BP at home. Diet is not healthy. He is not exercising. His wife is with him today. She is concerned about his health.   States his wife has told him he stops breathing in his sleep several times at night but his insurance company has not approved his sleep study.   States he has episodes of feeling like his heart is racing and has chest pressure mainly in the evening. Denies chest pain or palpitations today. No personal or family history of cardiac disease.  States he is overly stressed and on edge. Feeling like his job is too much to handle and he needs to cut back.  States he took medication at one point years ago for anxiety and stress and it helped.   Reports he is drinking a lot of alcohol but only does this on the weekends. He is aware that he is self medicating. Is not interested in counseling.   Denies fever, chills, dizziness, shortness of breath, abdominal pain, N/V/D, urinary symptoms, LE edema, leg pain with ambulation.  No recent surgery or immobilization. No history of DVT.   Past Medical History:  Diagnosis Date  . Elevated triglycerides with high cholesterol    2014  . Hyperlipidemia    Reviewed allergies, medications, past medical, surgical, family, and social history.   Review of Systems Pertinent positives and negatives in the history of present illness.     Objective:   Physical Exam BP (!) 130/100   Pulse 80   Resp 16   Ht 5' 7.5" (1.715 m)   Wt 241 lb 6.4 oz (109.5 kg)   SpO2 97%   BMI 37.25 kg/m   Alert and in no distress. Pharyngeal area is normal. Neck is supple without adenopathy or thyromegaly. Cardiac exam shows a regular sinus rhythm without murmurs or gallops.  Lungs are clear to auscultation. Extremities without edema, normal pulses. Skin is warm and dry, no pallor or rash.      Assessment & Plan:  Elevated blood pressure reading without diagnosis of hypertension - Plan: EKG 12-Lead  At risk for sleep apnea  Witnessed episode of apnea  Rapid palpitations - Plan: EKG 12-Lead  Chest pain, unspecified type - Plan: EKG 12-Lead  Mixed hyperlipidemia  Anxiety  Alcohol consumption binge drinking  Prediabetes  ECG NSR, rate 76  No chest pain with exertion. No sign of DVT or PE.  Suspicious that he has OSA and I will pursue home sleep study even though his insurance company denied this.  Strongly encouraged him to stop alcohol use and that this may be contributing to his anxiety and elevated BP.  Reviewed recent labs. He is aware that his lifestyle is putting him at risk for worsening health conditions.  Start on lisinopril and buy a BP cuff to start monitoring BP at home.  Cut back on salt.  Patient gave return demonstration on how to check his pulse so that next time he feels like his heart is racing he can check his pulse.  Will have him start on sertraline. Offered counseling and he declines.  Discussed cardiac risk factors.  Follow up in  2 weeks. Consider holter monitor

## 2018-05-05 NOTE — Telephone Encounter (Signed)
Please attempt to call the number for peer to peer and get me when they are on the line. Thanks.

## 2018-05-05 NOTE — Telephone Encounter (Signed)
Awaiting call back from insurance appeal office. KH

## 2018-05-05 NOTE — Patient Instructions (Signed)
Start 1/2 tablet of sertaline for the first week then increase to the whole tablet.   Take the lisinopril daily around the same time. Goal BP is <130/80.   Follow up in 2 weeks.

## 2018-05-05 NOTE — Telephone Encounter (Signed)
If you could try to call again for peer to peer

## 2018-05-10 NOTE — Telephone Encounter (Signed)
Zachary Schultz has been informed at the sleep study that this has been approved.  Pre number- [id number]

## 2018-05-19 ENCOUNTER — Encounter: Payer: BLUE CROSS/BLUE SHIELD | Admitting: Family Medicine

## 2018-05-31 ENCOUNTER — Encounter: Payer: Self-pay | Admitting: Family Medicine

## 2018-06-16 ENCOUNTER — Ambulatory Visit (HOSPITAL_BASED_OUTPATIENT_CLINIC_OR_DEPARTMENT_OTHER): Payer: BLUE CROSS/BLUE SHIELD | Attending: Family Medicine

## 2018-10-30 ENCOUNTER — Other Ambulatory Visit: Payer: Self-pay | Admitting: Family Medicine

## 2018-11-01 NOTE — Telephone Encounter (Signed)
Pt is due for a follow-up. His brother will have him call us back  I will refill med for 30 days only

## 2018-11-09 ENCOUNTER — Encounter: Payer: Self-pay | Admitting: Family Medicine

## 2018-11-09 ENCOUNTER — Ambulatory Visit: Payer: BLUE CROSS/BLUE SHIELD | Admitting: Family Medicine

## 2018-11-09 VITALS — BP 150/92 | HR 91 | Wt 250.0 lb

## 2018-11-09 DIAGNOSIS — F419 Anxiety disorder, unspecified: Secondary | ICD-10-CM

## 2018-11-09 DIAGNOSIS — R740 Nonspecific elevation of levels of transaminase and lactic acid dehydrogenase [LDH]: Secondary | ICD-10-CM

## 2018-11-09 DIAGNOSIS — R7303 Prediabetes: Secondary | ICD-10-CM

## 2018-11-09 DIAGNOSIS — G479 Sleep disorder, unspecified: Secondary | ICD-10-CM | POA: Insufficient documentation

## 2018-11-09 DIAGNOSIS — R079 Chest pain, unspecified: Secondary | ICD-10-CM

## 2018-11-09 DIAGNOSIS — E782 Mixed hyperlipidemia: Secondary | ICD-10-CM

## 2018-11-09 DIAGNOSIS — F439 Reaction to severe stress, unspecified: Secondary | ICD-10-CM

## 2018-11-09 DIAGNOSIS — I10 Essential (primary) hypertension: Secondary | ICD-10-CM | POA: Diagnosis not present

## 2018-11-09 DIAGNOSIS — E669 Obesity, unspecified: Secondary | ICD-10-CM | POA: Diagnosis not present

## 2018-11-09 DIAGNOSIS — F1010 Alcohol abuse, uncomplicated: Secondary | ICD-10-CM | POA: Diagnosis not present

## 2018-11-09 DIAGNOSIS — Z91199 Patient's noncompliance with other medical treatment and regimen due to unspecified reason: Secondary | ICD-10-CM

## 2018-11-09 DIAGNOSIS — Z9119 Patient's noncompliance with other medical treatment and regimen: Secondary | ICD-10-CM

## 2018-11-09 DIAGNOSIS — R0789 Other chest pain: Secondary | ICD-10-CM

## 2018-11-09 DIAGNOSIS — Z833 Family history of diabetes mellitus: Secondary | ICD-10-CM

## 2018-11-09 DIAGNOSIS — R7401 Elevation of levels of liver transaminase levels: Secondary | ICD-10-CM

## 2018-11-09 DIAGNOSIS — Z9189 Other specified personal risk factors, not elsewhere classified: Secondary | ICD-10-CM

## 2018-11-09 HISTORY — DX: Anxiety disorder, unspecified: F41.9

## 2018-11-09 HISTORY — DX: Reaction to severe stress, unspecified: F43.9

## 2018-11-09 MED ORDER — LISINOPRIL 10 MG PO TABS: 10.0000 mg | ORAL_TABLET | Freq: Every day | ORAL | 5 refills | Status: DC

## 2018-11-09 NOTE — Progress Notes (Signed)
Subjective:    Patient ID: Zachary Schultz, male    DOB: 1981-08-24, 37 y.o.   MRN: [id number]  HPI Chief Complaint  Patient presents with  . med check    med check on HTN. still having trouble sleeping, snoring and still having chest pain   He is here for 36-month medication management visit. States he still has all the same symptoms that he had at his previous visit but now "worse". Reports his diet, alcohol use and stress are all worse as well.   He did not get the sleep study as recommended for OSA. Reports he is still having persistent sleep issues including snoring, spells of apnea, headaches, excessive daytime sleepiness and fatigue.   He also continues to complain of intermittent chest pain that is occurring at least 2 days/week.  States chest pain is always midsternal, nonradiating, does not does appear to be associated with exertion.  The pain typically feels like pressure.  He has noted feeling a "strong heartbeat" at times.  States occasionally the pain is present upon awakening.  Pain lasts generally a full day when present.  Nothing seems to exacerbate or relieve his symptoms. Last episode of chest pain 3 days ago.  Denies shortness of breath, nausea vomiting associated with pain.  Started him on lisinopril 6 months ago for elevated blood pressure readings.  States he has been taking this most days but compliance is not great.  States he checks his blood pressure at home and in general the blood pressure readings are close to goal range.  States he has been out of the medication for the past couple of weeks.  Hyperlipidemia- LDL 157 in May 2019.   Alcohol- increased his intake.  States he is aware that this is his outlet for stress and anxiety.  States he has a very stressful job. States 2 days per week he drinks heavily and "blacks out" sometimes. States he stopped drinking alcohol for 30 days at 1 point this past year but started back. Family history of alcoholism.  States he  thinks he may have an issue with alcoholism.  Anxiety -stopped taking sertraline after 2 weeks of taking it.  Has not taken it since June. States he felt "numb". Did better with only half tablet.  States his "brain felt slow".  Denies SI or HI.   States his wife tries to help him with stress and anxiety and with his drinking.    Denies fever, chills, unexplained weight loss, dizziness, abdominal pain, nausea, vomiting, diarrhea, urinary symptoms, lower extremity edema.  Reviewed allergies, medications, past medical, surgical, family, and social history.    Review of Systems Pertinent positives and negatives in the history of present illness.     Objective:   Physical Exam BP (!) 150/92   Pulse 91   Wt 250 lb (113.4 kg)   SpO2 99%   BMI 38.58 kg/m   Alert and in no distress. Pharyngeal area is normal. Neck is supple without adenopathy or thyromegaly. Cardiac exam shows a regular sinus rhythm without murmurs or gallops. Lungs are clear to auscultation. Extremities without edema. Skin is warm and dry. No juandice. PERRLA, EOMs intact. Sclera anicteric.       Assessment & Plan:  Chest pressure - Plan: CBC with Differential/Platelet, Comprehensive metabolic panel, EKG 12-Lead, Ambulatory referral to Cardiology  Intermittent chest pain - Plan: EKG 12-Lead, Ambulatory referral to Cardiology  Sleep disturbances  Hypertension, unspecified type - Plan: Ambulatory referral to Cardiology  At risk  for obstructive sleep apnea  Prediabetes - Plan: Hemoglobin A1c  Obesity (BMI 30-39.9) - Plan: TSH, T4, free, T3  Alcohol consumption binge drinking - Plan: Comprehensive metabolic panel  Elevated ALT measurement  Family history of diabetes mellitus (DM) - Plan: Hemoglobin A1c  Mixed hyperlipidemia - Plan: Lipid panel  Stress  Anxiety  Personal history of noncompliance with medical treatment, presenting hazards to health  ECG NSR.  Questionable LVH.  Dr. Susann GivensLalonde also read this  EKG. Discussed that he most likely has sleep apnea but is not being treated and strongly encouraged him to call and schedule the home sleep study.  Advised that he stop taking melatonin or any sedating medications as well as stop alcohol use.  Discussed potential short-term and long-term consequences of untreated sleep apnea. Advised that he should start back on the lisinopril and keep a check on his blood pressure at home. Discussed that he appears to be self-medicating with alcohol. Recommend that he call and schedule with a counselor.  He declines psychotropic medication. Discussed that his noncompliance may lead to potential health consequences. Referral to cardiology for further evaluation of chest pain.  No known family history Check labs and follow-up in 2 weeks.

## 2018-11-09 NOTE — Patient Instructions (Addendum)
Start back on the lisinopril.  Buy a blood pressure cuff and start checking your blood pressure at home. Goal blood pressure readings are less than 130/80.  If you are seeing readings above this number then please let me know.  You will receive a phone call to schedule your sleep study.  You will also receive a phone call to schedule with the cardiologist.  You can call to schedule your appointment with the psychiatrist/counselor. A few offices are listed below for you to call.    [facility]  [address]  (across from [facility])  [phone]      [facility] [address] [place], [place] [address] [phone]   [facility]  [address], [place], [place] [address]  Phone: [phone]   [facility] [address] [place], [place] [address]  Phone: [phone]

## 2018-11-10 LAB — CBC WITH DIFFERENTIAL/PLATELET
BASOS ABS: 0.1 10*3/uL (ref 0.0–0.2)
BASOS: 1 %
EOS (ABSOLUTE): 0.1 10*3/uL (ref 0.0–0.4)
Eos: 2 %
Hematocrit: 45.9 % (ref 37.5–51.0)
Hemoglobin: 15.6 g/dL (ref 13.0–17.7)
IMMATURE GRANULOCYTES: 0 %
Immature Grans (Abs): 0 10*3/uL (ref 0.0–0.1)
Lymphocytes Absolute: 1.5 10*3/uL (ref 0.7–3.1)
Lymphs: 24 %
MCH: 31 pg (ref 26.6–33.0)
MCHC: 34 g/dL (ref 31.5–35.7)
MCV: 91 fL (ref 79–97)
Monocytes Absolute: 0.6 10*3/uL (ref 0.1–0.9)
Monocytes: 10 %
NEUTROS PCT: 63 %
Neutrophils Absolute: 3.8 10*3/uL (ref 1.4–7.0)
Platelets: 188 10*3/uL (ref 150–450)
RBC: 5.03 x10E6/uL (ref 4.14–5.80)
RDW: 12.8 % (ref 12.3–15.4)
WBC: 6.1 10*3/uL (ref 3.4–10.8)

## 2018-11-10 LAB — LIPID PANEL
Chol/HDL Ratio: 5.7 ratio — ABNORMAL HIGH (ref 0.0–5.0)
Cholesterol, Total: 204 mg/dL — ABNORMAL HIGH (ref 100–199)
HDL: 36 mg/dL — ABNORMAL LOW (ref >39)
LDL Calculated: 104 mg/dL — ABNORMAL HIGH (ref 0–99)
Triglycerides: 322 mg/dL — ABNORMAL HIGH (ref 0–149)
VLDL Cholesterol Cal: 64 mg/dL — ABNORMAL HIGH (ref 5–40)

## 2018-11-10 LAB — COMPREHENSIVE METABOLIC PANEL
ALT: 56 IU/L — ABNORMAL HIGH (ref 0–44)
AST: 32 IU/L (ref 0–40)
Albumin/Globulin Ratio: 1.8 (ref 1.2–2.2)
Albumin: 4.6 g/dL (ref 3.5–5.5)
Alkaline Phosphatase: 102 IU/L (ref 39–117)
BUN/Creatinine Ratio: 16 (ref 9–20)
BUN: 13 mg/dL (ref 6–20)
Bilirubin Total: 0.4 mg/dL (ref 0.0–1.2)
CALCIUM: 9.5 mg/dL (ref 8.7–10.2)
CO2: 23 mmol/L (ref 20–29)
Chloride: 99 mmol/L (ref 96–106)
Creatinine, Ser: 0.81 mg/dL (ref 0.76–1.27)
GFR, EST AFRICAN AMERICAN: 131 mL/min/{1.73_m2} (ref >59)
GFR, EST NON AFRICAN AMERICAN: 114 mL/min/{1.73_m2} (ref >59)
GLUCOSE: 106 mg/dL — AB (ref 65–99)
Globulin, Total: 2.6 g/dL (ref 1.5–4.5)
Potassium: 4.7 mmol/L (ref 3.5–5.2)
Sodium: 136 mmol/L (ref 134–144)
TOTAL PROTEIN: 7.2 g/dL (ref 6.0–8.5)

## 2018-11-10 LAB — HEMOGLOBIN A1C
Est. average glucose Bld gHb Est-mCnc: 117 mg/dL
Hgb A1c MFr Bld: 5.7 % — ABNORMAL HIGH (ref 4.8–5.6)

## 2018-11-10 LAB — TSH: TSH: 2.37 u[IU]/mL (ref 0.450–4.500)

## 2018-11-10 LAB — T3: T3, Total: 116 ng/dL (ref 71–180)

## 2018-11-10 LAB — T4, FREE: Free T4: 1.15 ng/dL (ref 0.82–1.77)

## 2018-11-25 ENCOUNTER — Ambulatory Visit (INDEPENDENT_AMBULATORY_CARE_PROVIDER_SITE_OTHER): Payer: BLUE CROSS/BLUE SHIELD | Admitting: Family Medicine

## 2018-11-25 ENCOUNTER — Encounter: Payer: Self-pay | Admitting: Family Medicine

## 2018-11-25 ENCOUNTER — Ambulatory Visit: Payer: BLUE CROSS/BLUE SHIELD | Admitting: Family Medicine

## 2018-11-25 VITALS — BP 132/96 | HR 118 | Wt 247.2 lb

## 2018-11-25 DIAGNOSIS — I10 Essential (primary) hypertension: Secondary | ICD-10-CM | POA: Diagnosis not present

## 2018-11-25 DIAGNOSIS — F1010 Alcohol abuse, uncomplicated: Secondary | ICD-10-CM | POA: Diagnosis not present

## 2018-11-25 DIAGNOSIS — E782 Mixed hyperlipidemia: Secondary | ICD-10-CM | POA: Diagnosis not present

## 2018-11-25 DIAGNOSIS — Z9189 Other specified personal risk factors, not elsewhere classified: Secondary | ICD-10-CM

## 2018-11-25 MED ORDER — ATORVASTATIN CALCIUM 10 MG PO TABS: 10.0000 mg | ORAL_TABLET | Freq: Every day | ORAL | 2 refills | Status: DC

## 2018-11-25 MED ORDER — LISINOPRIL 20 MG PO TABS: 20.0000 mg | ORAL_TABLET | Freq: Every day | ORAL | 2 refills | Status: DC

## 2018-11-25 NOTE — Progress Notes (Signed)
   Subjective:    Patient ID: Zachary Schultz, male    DOB: 10/23/1981, 38 y.o.   MRN: [id number]  HPI Chief Complaint  Patient presents with  . 2 week follow-up    2 week follow-up   He is here for a 2 week follow up on chest pain, HTN and probable OSA.   States he started back on lisinopril. BP at home has been elevated.  He has not yet heard from cardiology and has not scheduled a sleep study yet. He has been putting this off. Agrees to call and schedule it today.   States he drank 6 shots of tequila and 10 beers. He is feeling dehydrated.   Aware that his cholesterol is elevated. He is willing to start on a statin.   Denies fever, chills, dizziness, chest pain, palpitations, shortness of breath, abdominal pain, N/V/D, urinary symptoms, LE edema.   Reviewed allergies, medications, past medical, surgical, family, and social history.\    Review of Systems Pertinent positives and negatives in the history of present illness.     Objective:   Physical Exam BP (!) 132/96   Pulse (!) 118   Wt 247 lb 3.2 oz (112.1 kg)   BMI 38.15 kg/m   Alert and oriented and in no acute distress.       Assessment & Plan:  Hypertension, unspecified type - Plan: lisinopril (PRINIVIL,ZESTRIL) 20 MG tablet  At risk for sleep apnea  Alcohol consumption binge drinking  Mixed hyperlipidemia - Plan: atorvastatin (LIPITOR) 10 MG tablet  Here today for a 2-week follow-up.  His blood pressure is still quite elevated.  He is tachycardic and feeling dehydrated from excessive alcohol drinking yesterday.  Denies having chest pain or shortness of breath. He will increase lisinopril to 20 mg daily. Start on atorvastatin for hyperlipidemia. Counseling on stopping alcohol use as well as calling to schedule his sleep study as I have recommended on several occasions. He is awaiting a call from cardiology. I will have him follow-up in 6 weeks for recheck of hypertension and check fasting lipids as well.

## 2018-11-25 NOTE — Patient Instructions (Addendum)
Call and schedule your sleep study. This is at [facility]. [phone]  You may also call the cardiologist to schedule an appointment. This is a [facility] on [address]. [phone]  I am increasing your blood pressure medication to 20 mg of lisinopril.  Start taking the atorvastatin for your cholesterol as well.   Return in 6 weeks for an office visit and for recheck of fasting labs.      DASH Eating Plan DASH stands for "Dietary Approaches to Stop Hypertension." The DASH eating plan is a healthy eating plan that has been shown to reduce high blood pressure (hypertension). It may also reduce your risk for type 2 diabetes, heart disease, and stroke. The DASH eating plan may also help with weight loss. What are tips for following this plan?  General guidelines  Avoid eating more than 2,300 mg (milligrams) of salt (sodium) a day. If you have hypertension, you may need to reduce your sodium intake to 1,500 mg a day.  Limit alcohol intake to no more than 1 drink a day for nonpregnant women and 2 drinks a day for men. One drink equals 12 oz of beer, 5 oz of wine, or 1 oz of hard liquor.  Work with your health care provider to maintain a healthy body weight or to lose weight. Ask what an ideal weight is for you.  Get at least 30 minutes of exercise that causes your heart to beat faster (aerobic exercise) most days of the week. Activities may include walking, swimming, or biking.  Work with your health care provider or diet and nutrition specialist (dietitian) to adjust your eating plan to your individual calorie needs. Reading food labels   Check food labels for the amount of sodium per serving. Choose foods with less than 5 percent of the Daily Value of sodium. Generally, foods with less than 300 mg of sodium per serving fit into this eating plan.  To find whole grains, look for the word "whole" as the first word in the ingredient list. Shopping  Buy products labeled as  "low-sodium" or "no salt added."  Buy fresh foods. Avoid canned foods and premade or frozen meals. Cooking  Avoid adding salt when cooking. Use salt-free seasonings or herbs instead of table salt or sea salt. Check with your health care provider or pharmacist before using salt substitutes.  Do not fry foods. Cook foods using healthy methods such as baking, boiling, grilling, and broiling instead.  Cook with heart-healthy oils, such as olive, canola, soybean, or sunflower oil. Meal planning  Eat a balanced diet that includes: ? 5 or more servings of fruits and vegetables each day. At each meal, try to fill half of your plate with fruits and vegetables. ? Up to 6-8 servings of whole grains each day. ? Less than 6 oz of lean meat, poultry, or fish each day. A 3-oz serving of meat is about the same size as a deck of cards. One egg equals 1 oz. ? 2 servings of low-fat dairy each day. ? A serving of nuts, seeds, or beans 5 times each week. ? Heart-healthy fats. Healthy fats called Omega-3 fatty acids are found in foods such as flaxseeds and coldwater fish, like sardines, salmon, and mackerel.  Limit how much you eat of the following: ? Canned or prepackaged foods. ? Food that is high in trans fat, such as fried foods. ? Food that is high in saturated fat, such as fatty meat. ? Sweets, desserts, sugary drinks, and  other foods with added sugar. ? Full-fat dairy products.  Do not salt foods before eating.  Try to eat at least 2 vegetarian meals each week.  Eat more home-cooked food and less restaurant, buffet, and fast food.  When eating at a restaurant, ask that your food be prepared with less salt or no salt, if possible. What foods are recommended? The items listed may not be a complete list. Talk with your dietitian about what dietary choices are best for you. Grains Whole-grain or whole-wheat bread. Whole-grain or whole-wheat pasta. Brown rice. Modena Morrow. Bulgur. Whole-grain  and low-sodium cereals. Pita bread. Low-fat, low-sodium crackers. Whole-wheat flour tortillas. Vegetables Fresh or frozen vegetables (raw, steamed, roasted, or grilled). Low-sodium or reduced-sodium tomato and vegetable juice. Low-sodium or reduced-sodium tomato sauce and tomato paste. Low-sodium or reduced-sodium canned vegetables. Fruits All fresh, dried, or frozen fruit. Canned fruit in natural juice (without added sugar). Meat and other protein foods Skinless chicken or [place]. Ground chicken or [place]. Pork with fat trimmed off. Fish and seafood. Egg whites. Dried beans, peas, or lentils. Unsalted nuts, nut butters, and seeds. Unsalted canned beans. Lean cuts of beef with fat trimmed off. Low-sodium, lean deli meat. Dairy Low-fat (1%) or fat-free (skim) milk. Fat-free, low-fat, or reduced-fat cheeses. Nonfat, low-sodium ricotta or cottage cheese. Low-fat or nonfat yogurt. Low-fat, low-sodium cheese. Fats and oils Soft margarine without trans fats. Vegetable oil. Low-fat, reduced-fat, or light mayonnaise and salad dressings (reduced-sodium). Canola, safflower, olive, soybean, and sunflower oils. Avocado. Seasoning and other foods Herbs. Spices. Seasoning mixes without salt. Unsalted popcorn and pretzels. Fat-free sweets. What foods are not recommended? The items listed may not be a complete list. Talk with your dietitian about what dietary choices are best for you. Grains Baked goods made with fat, such as croissants, muffins, or some breads. Dry pasta or rice meal packs. Vegetables Creamed or fried vegetables. Vegetables in a cheese sauce. Regular canned vegetables (not low-sodium or reduced-sodium). Regular canned tomato sauce and paste (not low-sodium or reduced-sodium). Regular tomato and vegetable juice (not low-sodium or reduced-sodium). Angie Fava. Olives. Fruits Canned fruit in a light or heavy syrup. Fried fruit. Fruit in cream or butter sauce. Meat and other protein foods Fatty cuts  of meat. Ribs. Fried meat. Berniece Salines. Sausage. Bologna and other processed lunch meats. Salami. Fatback. Hotdogs. Bratwurst. Salted nuts and seeds. Canned beans with added salt. Canned or smoked fish. Whole eggs or egg yolks. Chicken or [place] with skin. Dairy Whole or 2% milk, cream, and half-and-half. Whole or full-fat cream cheese. Whole-fat or sweetened yogurt. Full-fat cheese. Nondairy creamers. Whipped toppings. Processed cheese and cheese spreads. Fats and oils Butter. Stick margarine. Lard. Shortening. Ghee. Bacon fat. Tropical oils, such as coconut, palm kernel, or palm oil. Seasoning and other foods Salted popcorn and pretzels. Onion salt, garlic salt, seasoned salt, table salt, and sea salt. Worcestershire sauce. Tartar sauce. Barbecue sauce. Teriyaki sauce. Soy sauce, including reduced-sodium. Steak sauce. Canned and packaged gravies. Fish sauce. Oyster sauce. Cocktail sauce. Horseradish that you find on the shelf. Ketchup. Mustard. Meat flavorings and tenderizers. Bouillon cubes. Hot sauce and Tabasco sauce. Premade or packaged marinades. Premade or packaged taco seasonings. Relishes. Regular salad dressings. Where to find more information:  National Heart, Lung, and [place]: https://wilson-eaton.com/  American Heart Association: www.heart.org Summary  The DASH eating plan is a healthy eating plan that has been shown to reduce high blood pressure (hypertension). It may also reduce your risk for type 2 diabetes, heart disease, and stroke.  With the DASH eating plan, you should limit salt (sodium) intake to 2,300 mg a day. If you have hypertension, you may need to reduce your sodium intake to 1,500 mg a day.  When on the DASH eating plan, aim to eat more fresh fruits and vegetables, whole grains, lean proteins, low-fat dairy, and heart-healthy fats.  Work with your health care provider or diet and nutrition specialist (dietitian) to adjust your eating plan to your individual calorie  needs. This information is not intended to replace advice given to you by your health care provider. Make sure you discuss any questions you have with your health care provider. Document Released: 10/30/2011 Document Revised: 11/03/2016 Document Reviewed: 11/03/2016 Elsevier Interactive Patient Education  2019 [employer].

## 2018-11-30 NOTE — Progress Notes (Signed)
Cardiology Office Note   Date:  12/02/2018   ID:  Zachary Schultz, DOB 25-Jul-1981, MRN [id number]  PCP:  Avanell Shackleton, NP-C  Cardiologist:   Charlton Haws, MD   No chief complaint on file.     History of Present Illness: Zachary Schultz is a 38 y.o. male who presents for consultation regarding chest pain Referred by Hetty Blend NP.  He drinks heavily. Rx for HLD, HTN and OSA.  Seen in primary office 11/09/18 intermittent chest pain. Atypical left sided not exertional    No history of congenital / CAD. No family history of premature CAD.  Uncle is alcoholic From [place] lots of family here that work with him  From [place] Lots of pressure running landscaping business  SSCP mostly resting in am when he wakes up Calvin intermittent can last minutes   Sedentary Has gained a lot of weight  Tried AA 2 years ago " but not for me "   Past Medical History:  Diagnosis Date  . Anxiety 11/09/2018  . Elevated triglycerides with high cholesterol    2014  . Hyperlipidemia   . Stress 11/09/2018    History reviewed. No pertinent surgical history.   Current Outpatient Medications  Medication Sig Dispense Refill  . atorvastatin (LIPITOR) 10 MG tablet Take 1 tablet (10 mg total) by mouth daily. 30 tablet 2  . lisinopril (PRINIVIL,ZESTRIL) 20 MG tablet Take 1 tablet (20 mg total) by mouth daily. 30 tablet 2   No current facility-administered medications for this visit.     Allergies:   Patient has no known allergies.    Social History:  The patient  reports that he has quit smoking. His smoking use included cigarettes. He has never used smokeless tobacco. He reports current alcohol use of about 24.0 standard drinks of alcohol per week. He reports current drug use. Drug: Marijuana.   Family History:  The patient's family history includes Diabetes in his father.    ROS:  Please see the history of present illness.   Otherwise, review of systems are positive for none.   All other systems are  reviewed and negative.    PHYSICAL EXAM: VS:  BP 122/88   Pulse 90   Ht 5' 7.5" (1.715 m)   Wt 245 lb 12.8 oz (111.5 kg)   SpO2 94%   BMI 37.93 kg/m  , BMI Body mass index is 37.93 kg/m. Affect appropriate Healthy:  appears stated age HEENT: normal Neck supple with no adenopathy JVP normal no bruits no thyromegaly Lungs clear with no wheezing and good diaphragmatic motion Heart:  S1/S2 no murmur, no rub, gallop or click PMI normal Abdomen: benighn, BS positve, no tenderness, no AAA no bruit.  No HSM or HJR Distal pulses intact with no bruits No edema Neuro non-focal Skin warm and dry No muscular weakness    EKG:  SR rate 75 normal ECG 11/09/18   Recent Labs: 11/09/2018: ALT 56; BUN 13; Creatinine, Ser 0.81; Hemoglobin 15.6; Platelets 188; Potassium 4.7; Sodium 136; TSH 2.370    Lipid Panel    Component Value Date/Time   CHOL 204 (H) 11/09/2018 0930   TRIG 322 (H) 11/09/2018 0930   HDL 36 (L) 11/09/2018 0930   CHOLHDL 5.7 (H) 11/09/2018 0930   CHOLHDL 6.1 (H) 08/20/2016 1128   VLDL 54 (H) 08/20/2016 1128   LDLCALC 104 (H) 11/09/2018 0930      Wt Readings from Last 3 Encounters:  12/02/18 245 lb 12.8 oz (111.5 kg)  11/25/18 247 lb 3.2 oz (112.1 kg)  11/09/18 250 lb (113.4 kg)      Other studies Reviewed: Additional studies/ records that were reviewed today include: Notes from primary labs and ECG .    ASSESSMENT AND PLAN:  1.  Chest pain :  Atypical normal ECG f/u ETT 2. HTN:  Continue current meds discussed difficulty Rx due to binge drinking 3. HLD:  Continue statin labs with primary  4. ETOH:  Needs counseling and likely best for inpatient Rx suspect some of his pain in chest is from PUD/esophageal  TTE for EF to r/o DCM     Current medicines are reviewed at length with the patient today.  The patient does not have concerns regarding medicines.  The following changes have been made:  no change  Labs/ tests ordered today include: ETT and TTE     Orders Placed This Encounter  Procedures  . EXERCISE TOLERANCE TEST (ETT)  . ECHOCARDIOGRAM COMPLETE     Disposition:   FU with cardiology PRN      Signed, Charlton Haws, MD  12/02/2018 9:26 AM    [facility] [address] [place], [place], [place]  [address] Phone: [phone]; Fax: [phone]

## 2018-12-02 ENCOUNTER — Ambulatory Visit: Payer: BLUE CROSS/BLUE SHIELD | Admitting: Cardiovascular Disease

## 2018-12-02 ENCOUNTER — Encounter: Payer: Self-pay | Admitting: Cardiovascular Disease

## 2018-12-02 VITALS — BP 122/88 | HR 90 | Ht 67.5 in | Wt 245.8 lb

## 2018-12-02 DIAGNOSIS — R079 Chest pain, unspecified: Secondary | ICD-10-CM

## 2018-12-02 DIAGNOSIS — R0600 Dyspnea, unspecified: Secondary | ICD-10-CM

## 2018-12-02 NOTE — Patient Instructions (Signed)
Medication Instructions:   If you need a refill on your cardiac medications before your next appointment, please call your pharmacy.   Lab work:  If you have labs (blood work) drawn today and your tests are completely normal, you will receive your results only by: . MyChart Message (if you have MyChart) OR . A paper copy in the mail If you have any lab test that is abnormal or we need to change your treatment, we will call you to review the results.  Testing/Procedures: Your physician has requested that you have an echocardiogram. Echocardiography is a painless test that uses sound waves to create images of your heart. It provides your doctor with information about the size and shape of your heart and how well your heart's chambers and valves are working. This procedure takes approximately one hour. There are no restrictions for this procedure.  Your physician has requested that you have an exercise tolerance test. For further information please visit www.cardiosmart.org. Please also follow instruction sheet, as given.  Follow-Up: At [facility], you and your health needs are our priority.  As part of our continuing mission to provide you with exceptional heart care, we have created designated Provider Care Teams.  These Care Teams include your primary Cardiologist (physician) and Advanced Practice Providers (APPs -  Physician Assistants and Nurse Practitioners) who all work together to provide you with the care you need, when you need it. Your physician recommends that you schedule a follow-up appointment as needed with Dr. Nishan.  

## 2018-12-07 ENCOUNTER — Other Ambulatory Visit (HOSPITAL_COMMUNITY): Payer: BLUE CROSS/BLUE SHIELD

## 2018-12-10 ENCOUNTER — Ambulatory Visit: Payer: BLUE CROSS/BLUE SHIELD | Admitting: Cardiovascular Disease

## 2018-12-14 ENCOUNTER — Ambulatory Visit (HOSPITAL_COMMUNITY): Payer: BLUE CROSS/BLUE SHIELD | Attending: Cardiovascular Disease

## 2018-12-14 ENCOUNTER — Ambulatory Visit (INDEPENDENT_AMBULATORY_CARE_PROVIDER_SITE_OTHER): Payer: BLUE CROSS/BLUE SHIELD

## 2018-12-14 ENCOUNTER — Other Ambulatory Visit: Payer: Self-pay

## 2018-12-14 DIAGNOSIS — R0600 Dyspnea, unspecified: Secondary | ICD-10-CM | POA: Insufficient documentation

## 2018-12-14 DIAGNOSIS — R079 Chest pain, unspecified: Secondary | ICD-10-CM

## 2018-12-16 LAB — EXERCISE TOLERANCE TEST
CHL RATE OF PERCEIVED EXERTION: 16
CSEPEDS: 0 s
Estimated workload: 10.1 METS
Exercise duration (min): 8 min
MPHR: 183 {beats}/min
Peak HR: 179 {beats}/min
Percent HR: 97 %
Rest HR: 88 {beats}/min

## 2019-01-06 ENCOUNTER — Ambulatory Visit: Payer: BLUE CROSS/BLUE SHIELD | Admitting: Family Medicine

## 2019-01-10 ENCOUNTER — Ambulatory Visit: Payer: BLUE CROSS/BLUE SHIELD | Admitting: Family Medicine

## 2019-01-10 ENCOUNTER — Ambulatory Visit
Admission: RE | Admit: 2019-01-10 | Discharge: 2019-01-10 | Disposition: A | Discharge status: Home / Self Care | Payer: BLUE CROSS/BLUE SHIELD | Source: Ambulatory Visit | Attending: Family Medicine | Admitting: Family Medicine

## 2019-01-10 ENCOUNTER — Encounter: Payer: Self-pay | Admitting: Family Medicine

## 2019-01-10 VITALS — BP 120/80 | HR 91 | Wt 240.0 lb

## 2019-01-10 DIAGNOSIS — I10 Essential (primary) hypertension: Secondary | ICD-10-CM

## 2019-01-10 DIAGNOSIS — R0681 Apnea, not elsewhere classified: Secondary | ICD-10-CM

## 2019-01-10 DIAGNOSIS — R740 Nonspecific elevation of levels of transaminase and lactic acid dehydrogenase [LDH]: Secondary | ICD-10-CM

## 2019-01-10 DIAGNOSIS — Z79899 Other long term (current) drug therapy: Secondary | ICD-10-CM

## 2019-01-10 DIAGNOSIS — M25619 Stiffness of unspecified shoulder, not elsewhere classified: Secondary | ICD-10-CM

## 2019-01-10 DIAGNOSIS — M25512 Pain in left shoulder: Secondary | ICD-10-CM | POA: Diagnosis not present

## 2019-01-10 DIAGNOSIS — Z9189 Other specified personal risk factors, not elsewhere classified: Secondary | ICD-10-CM

## 2019-01-10 DIAGNOSIS — E669 Obesity, unspecified: Secondary | ICD-10-CM | POA: Diagnosis not present

## 2019-01-10 DIAGNOSIS — E785 Hyperlipidemia, unspecified: Secondary | ICD-10-CM | POA: Diagnosis not present

## 2019-01-10 DIAGNOSIS — R7401 Elevation of levels of liver transaminase levels: Secondary | ICD-10-CM

## 2019-01-10 NOTE — Patient Instructions (Addendum)
[facility] will give you a call later on. If you have not heard from them within the next couple days. Please call them back [phone]  Go to [facility] imaging for the x-ray of your left shoulder. We will call you with the result.  You will get a call from [facility] to schedule an appointment.  For now, avoid doing the rowing machine or any other activities that irritate your left shoulder.  Continue exercising by biking or doing other lower body exercises.  Your blood pressure is normal today at 120/80.  Continue on the medication.  Also, continue eating a low-salt diet.  Congratulations on stopping alcohol and making healthy dietary changes.  Continue on the cholesterol medication for now.  We will recheck your lipids today.  Follow-up in 3 months or sooner if needed.

## 2019-01-10 NOTE — Progress Notes (Signed)
Subjective:    Patient ID: Zachary Schultz, male    DOB: [birth year], 38 y.o.   MRN: [id number]  HPI Chief Complaint  Patient presents with  . follow-up    follow-up on HTN, not fasting- had apple to eat.    He is a walk in this morning requesting a follow up on HTN, sleep, hyperlipidemia.  He has a new complaint of left shoulder pain. No known injury.  Blood pressures at home have been variable with several in goal range per patient.  States he has lost weight due to eating a healthy diet and stopping alcohol.  He has a history of binge drinking and states he did not drink anything for 30 days.   He saw cardiology on 12/02/2018 and had exercise stress test and echo that was negative and will follow with cardiology on a as needed basis.  He has not had any chest pain recently.  Denies DOE.  He continues to report witnessed episodes of sleep apnea per his wife.  He is not getting adequate sleep.I am strongly suspicious that he has sleep apnea.  I ordered a sleep study several months ago but he did not follow through with this.  He reports trying to contact [facility] to set up a home sleep test.  States he has not heard back from them.  Reports taking daily statin.  Denies having any side effects.  Acute complaint of severe left shoulder pain for the past week or more.  States he noticed this while doing a row machine workout at home.  Reports having limited range of motion, unable to lift his arm above 90 degrees.  He also reports pain when laying on his left arm at night.  Questionable radicular symptoms. History of intermittent mild left shoulder pain and never got this checked. Denies taking anything for his symptoms.  Denies fever, chills, dizziness, chest pain, palpitations, shortness of breath, abdominal pain, nausea, vomiting, diarrhea.  Reviewed allergies, medications, past medical, surgical, family, and social history.   Review of Systems Pertinent positives and negatives in the  history of present illness.     Objective:   Physical Exam Constitutional:      Appearance: Normal appearance.  Musculoskeletal:     Right shoulder: Normal.     Left shoulder: He exhibits normal range of motion, no tenderness, no pain and normal strength.     Cervical back: Normal.     Comments: Left lateral shoulder TTP. Limited ROM. Negative empty can test. Unable to perform Neers and Hawkins testing. Unable to do lift off test.  Left bicep and tricep intact.  Normal sensation, capillary refill and pulse of LUE.   Skin:    General: Skin is warm and dry.     Capillary Refill: Capillary refill takes less than 2 seconds.     Findings: No bruising or rash.  Neurological:     Mental Status: He is alert.    BP 120/80   Pulse 91   Wt 240 lb (108.9 kg)   BMI 37.03 kg/m       Assessment & Plan:  Hypertension, unspecified type - Plan: CBC with Differential/Platelet, Comprehensive metabolic panel  Obesity (BMI 30-39.9)  At risk for sleep apnea  Witnessed episode of apnea  Acute pain of left shoulder - Plan: DG Shoulder Left, Ambulatory referral to Orthopedic Surgery  Elevated ALT measurement  Medication management - Plan: Lipid panel  Hyperlipidemia, unspecified hyperlipidemia type - Plan: Lipid panel  Limited range of motion (  ROM) of shoulder - Plan: Ambulatory referral to Orthopedic Surgery  He walked in today for a follow-up visit on hypertension, sleep issues, alcohol use and hyperlipidemia.  He has a new complaint of left shoulder pain. Hypertension-reports good daily compliance of lisinopril and no side effects.  He checks his blood pressure at home and reports improved readings.  Blood pressure is in normal range today.  He will continue on the medication and continue with low-sodium diet. Congratulated him on stopping alcohol for the past month.  He is also made healthy dietary changes.  He has had intentional weight loss.  Encouraged him to keep this healthy  lifestyle going. I suspect he has sleep apnea.  I have ordered a sleep study and he reports that he has been trying to get in touch finally to get the home test set up.  [facility] will now try to call him again.  Martie Lee did call and speak with them. Reports taking statin without any issues.  We will recheck lipid panel in CMP.  He had elevated ALT that was not related to statin and was most likely due to alcohol use.  We discussed potential liver complications with statin and alcohol use.  If he starts back drinking alcohol we may need to stop the statin again. He has been exercising by using a rowing machine at home and reports severe left shoulder pain with a history of mild intermittent shoulder pain.  He has limited range of motion.  Normal sensation.  We will order a x-ray and refer to orthopedics for further evaluation.  I am hesitant to start him on a daily NSAID due to the potential for alcohol binge drinking and recent elevated liver function test. Follow-up 4 weeks after starting on CPAP machine, if indicated, or in 3 months for follow-up of chronic health conditions.

## 2019-01-11 LAB — COMPREHENSIVE METABOLIC PANEL
ALT: 44 IU/L (ref 0–44)
AST: 23 IU/L (ref 0–40)
Albumin/Globulin Ratio: 2 (ref 1.2–2.2)
Albumin: 4.8 g/dL (ref 4.0–5.0)
Alkaline Phosphatase: 92 IU/L (ref 39–117)
BILIRUBIN TOTAL: 0.5 mg/dL (ref 0.0–1.2)
BUN/Creatinine Ratio: 14 (ref 9–20)
BUN: 12 mg/dL (ref 6–20)
CO2: 24 mmol/L (ref 20–29)
Calcium: 9.4 mg/dL (ref 8.7–10.2)
Chloride: 102 mmol/L (ref 96–106)
Creatinine, Ser: 0.88 mg/dL (ref 0.76–1.27)
GFR calc Af Amer: 127 mL/min/{1.73_m2} (ref >59)
GFR calc non Af Amer: 110 mL/min/{1.73_m2} (ref >59)
Globulin, Total: 2.4 g/dL (ref 1.5–4.5)
Glucose: 91 mg/dL (ref 65–99)
Potassium: 4.5 mmol/L (ref 3.5–5.2)
SODIUM: 139 mmol/L (ref 134–144)
Total Protein: 7.2 g/dL (ref 6.0–8.5)

## 2019-01-11 LAB — CBC WITH DIFFERENTIAL/PLATELET
Basophils Absolute: 0.1 10*3/uL (ref 0.0–0.2)
Basos: 1 %
EOS (ABSOLUTE): 0.2 10*3/uL (ref 0.0–0.4)
Eos: 2 %
Hematocrit: 44.3 % (ref 37.5–51.0)
Hemoglobin: 15.6 g/dL (ref 13.0–17.7)
Immature Grans (Abs): 0 10*3/uL (ref 0.0–0.1)
Immature Granulocytes: 0 %
LYMPHS ABS: 1.8 10*3/uL (ref 0.7–3.1)
Lymphs: 23 %
MCH: 31 pg (ref 26.6–33.0)
MCHC: 35.2 g/dL (ref 31.5–35.7)
MCV: 88 fL (ref 79–97)
MONOS ABS: 0.7 10*3/uL (ref 0.1–0.9)
Monocytes: 9 %
Neutrophils Absolute: 5 10*3/uL (ref 1.4–7.0)
Neutrophils: 65 %
Platelets: 201 10*3/uL (ref 150–450)
RBC: 5.04 x10E6/uL (ref 4.14–5.80)
RDW: 12.3 % (ref 11.6–15.4)
WBC: 7.7 10*3/uL (ref 3.4–10.8)

## 2019-01-11 LAB — LIPID PANEL
Chol/HDL Ratio: 5.1 ratio — ABNORMAL HIGH (ref 0.0–5.0)
Cholesterol, Total: 183 mg/dL (ref 100–199)
HDL: 36 mg/dL — ABNORMAL LOW (ref >39)
LDL Calculated: 113 mg/dL — ABNORMAL HIGH (ref 0–99)
Triglycerides: 170 mg/dL — ABNORMAL HIGH (ref 0–149)
VLDL Cholesterol Cal: 34 mg/dL (ref 5–40)

## 2019-01-12 ENCOUNTER — Ambulatory Visit (INDEPENDENT_AMBULATORY_CARE_PROVIDER_SITE_OTHER): Payer: BLUE CROSS/BLUE SHIELD | Admitting: Orthopedic Surgery

## 2019-01-12 ENCOUNTER — Ambulatory Visit: Payer: BLUE CROSS/BLUE SHIELD | Admitting: Family Medicine

## 2019-01-14 ENCOUNTER — Other Ambulatory Visit: Payer: Self-pay | Admitting: Internal Medicine

## 2019-01-14 MED ORDER — ATORVASTATIN CALCIUM 20 MG PO TABS: 20.0000 mg | ORAL_TABLET | Freq: Every day | ORAL | 1 refills | Status: DC

## 2019-01-17 ENCOUNTER — Encounter: Payer: Self-pay | Admitting: Internal Medicine

## 2019-02-04 ENCOUNTER — Encounter (HOSPITAL_BASED_OUTPATIENT_CLINIC_OR_DEPARTMENT_OTHER): Payer: BLUE CROSS/BLUE SHIELD

## 2019-02-09 ENCOUNTER — Ambulatory Visit (HOSPITAL_BASED_OUTPATIENT_CLINIC_OR_DEPARTMENT_OTHER): Payer: BLUE CROSS/BLUE SHIELD | Attending: Family Medicine | Admitting: Internal Medicine

## 2019-02-09 ENCOUNTER — Other Ambulatory Visit: Payer: Self-pay

## 2019-02-09 DIAGNOSIS — G4733 Obstructive sleep apnea (adult) (pediatric): Secondary | ICD-10-CM | POA: Diagnosis not present

## 2019-02-09 DIAGNOSIS — R030 Elevated blood-pressure reading, without diagnosis of hypertension: Secondary | ICD-10-CM | POA: Insufficient documentation

## 2019-02-09 DIAGNOSIS — R5383 Other fatigue: Secondary | ICD-10-CM

## 2019-02-09 DIAGNOSIS — G4719 Other hypersomnia: Secondary | ICD-10-CM

## 2019-02-09 DIAGNOSIS — Z9189 Other specified personal risk factors, not elsewhere classified: Secondary | ICD-10-CM

## 2019-02-18 DIAGNOSIS — S61203A Unspecified open wound of left middle finger without damage to nail, initial encounter: Secondary | ICD-10-CM | POA: Diagnosis not present

## 2019-02-23 ENCOUNTER — Ambulatory Visit (INDEPENDENT_AMBULATORY_CARE_PROVIDER_SITE_OTHER): Payer: BLUE CROSS/BLUE SHIELD | Admitting: Family Medicine

## 2019-02-23 ENCOUNTER — Encounter: Payer: Self-pay | Admitting: Family Medicine

## 2019-02-23 ENCOUNTER — Other Ambulatory Visit: Payer: Self-pay

## 2019-02-23 VITALS — BP 127/103 | HR 109 | Wt 249.0 lb

## 2019-02-23 DIAGNOSIS — F419 Anxiety disorder, unspecified: Secondary | ICD-10-CM | POA: Diagnosis not present

## 2019-02-23 DIAGNOSIS — F1020 Alcohol dependence, uncomplicated: Secondary | ICD-10-CM

## 2019-02-23 DIAGNOSIS — I10 Essential (primary) hypertension: Secondary | ICD-10-CM | POA: Diagnosis not present

## 2019-02-23 MED ORDER — LISINOPRIL-HYDROCHLOROTHIAZIDE 20-12.5 MG PO TABS: 1.0000 | ORAL_TABLET | Freq: Every day | ORAL | 2 refills | Status: DC

## 2019-02-23 MED ORDER — CITALOPRAM HYDROBROMIDE 20 MG PO TABS: 20.0000 mg | ORAL_TABLET | Freq: Every day | ORAL | 1 refills | Status: DC

## 2019-02-23 NOTE — Progress Notes (Signed)
Subjective:    Patient ID: Zachary Schultz, male    DOB: [birth year], 38 y.o.   MRN: [id number]  Documentation for virtual telephone encounter.  The provider was located in the office. The patient did consent to this visit and is aware of possible charges through their insurance for this visit.  The other persons participating in this telemedicine service were none.   HPI Chief Complaint  Patient presents with  . BP high    bp high - yesterday it was was 140/ 90's, symptoms- lightheaded, chest hurting- anxiety and insomnia     Complains of a 3-day history of elevated blood pressure, chest pressure, heart racing and occasional lightheadedness.  States he is unable to sleep because he thinks he "is going to die".  History of anxiety and alcohol dependence with binge drinking.  Not currently on medication for anxiety and is not involved in counseling. States he drank alcohol heavily this weekend.  States he does not take his blood pressure medication over the weekend.  He is very concerned about his blood pressure being elevated.  States even prior to the past few days, his blood pressure has not been well controlled.  States he is aware that he needs to stop alcohol. He plans to avoid alcohol.  He is considering going to an AA meeting.  This is the first time he has thought about going.  Reports being under a lot of stress at home and with work and with the pandemic and he has been self-medicating with alcohol. Denies SI or HI.  Denies fever, chills, dizziness, vision changes, shortness of breath, abdominal pain, N/V/D, LE edema.   He did have a recent sleep study and we are awaiting his results.  Strongly suspicious for sleep apnea and his need for a CPAP but we will need to verify that this is the case.   Review of Systems Pertinent positives and negatives in the history of present illness.     Objective:   Physical Exam BP (!) 127/103   Pulse (!) 109   Wt 249 lb (112.9 kg)   BMI  38.42 kg/m   Alert and oriented and in no acute distress.       Assessment & Plan:  Anxiety - Plan: citalopram (CELEXA) 20 MG tablet  Uncontrolled hypertension - Plan: lisinopril-hydrochlorothiazide (ZESTORETIC) 20-12.5 MG tablet  Alcohol dependence, binge pattern (HCC)  Discussed several ways to self calm by deep breathing, counting backwards etc and advised him to check his carotid pulse when he feels like it is racing. He was able to find his carotid pulse. No thoughts of self harm.  Reviewed recent office notes and results from cardiology evaluation.  He agrees to stop alcohol and go to an [employer]. He will let me know if he needs inpatient treatment but he does not want this now.  Discussed that he should not be on medication if he is also drinking alcohol, since he is in agreement to stop alcohol I will agree to starting him on low dose Celexa. Discussed starting on half tablet for the first week and call me next week to let me know how he is doing.  Discussed that he needs to be compliant with medication for HTN. Since he reports uncontrolled readings for several weeks, we will change his medication from Losartan to Losartan-HCTZ. Discussed potential side effects.  He will continue to check BP readings at home.  Follow up virtual visit in one week or sooner if needed.  This telephone service is not related to other E/M service within previous 7 days.  Patient consented to the consult.  Time involving medical discussion was 26 minutes.  99441 (5-14min) 12878 (11-73min) 67672 (21-28min)

## 2019-02-26 DIAGNOSIS — R5383 Other fatigue: Secondary | ICD-10-CM | POA: Diagnosis not present

## 2019-02-26 NOTE — Procedures (Signed)
    Patient Name: Zachary Schultz, Zachary Schultz Date: 02/09/2019 Gender: Male D.O.B: 01-12-1981 Age (years): 37 Referring Provider: Avanell Shackleton NP Height (inches): 67 Interpreting Physician: Jetty Duhamel MD, ABSM Weight (lbs): 240 RPSGT: [place] BMI: 38 MRN: [id number] Neck Size: 18.50  CLINICAL INFORMATION Sleep Study Type: HST Indication for sleep study: Excessive Daytime Sleepiness (780.79), Fatigue Epworth Sleepiness Score: 9  SLEEP STUDY TECHNIQUE A multi-channel overnight portable sleep study was performed. The channels recorded were: nasal airflow, thoracic respiratory movement, and oxygen saturation with a pulse oximetry. Snoring was also monitored.  MEDICATIONS Patient self administered medications include: none reported.  SLEEP ARCHITECTURE Patient was studied for 255.6 minutes. The sleep efficiency was 100.0 % and the patient was supine for 37.3%. The arousal index was 0.0 per hour.  RESPIRATORY PARAMETERS The overall AHI was 22.3 per hour, with a central apnea index of 0.0 per hour. The oxygen nadir was 85% during sleep.  CARDIAC DATA Mean heart rate during sleep was 72.5 bpm.  IMPRESSIONS - Moderate obstructive sleep apnea occurred during this study (AHI = 22.3/h). - No significant central sleep apnea occurred during this study (CAI = 0.0/h). - Moderate oxygen desaturation was noted during this study (Min O2 = 85%). Mean sat 94.0%. - Patient snored.  DIAGNOSIS - Obstructive Sleep Apnea (327.23 [G47.33 ICD-10])  RECOMMENDATIONS - Suggest CPAP titration sleep study or autopap. Other options, including a fitted oral appliance, Sleep medical consultation or ENT evaluation, would be based on clinical judgment. - Be careful with alcohol, sedatives and other CNS depressants that may worsen sleep apnea and disrupt normal sleep architecture. - Sleep hygiene should be reviewed to assess factors that may improve sleep quality. - Weight management and regular  exercise should be initiated or continued.  [Electronically signed] 02/26/2019 01:04 PM  Jetty Duhamel MD, ABSM Diplomate, American Board of Sleep Medicine   NPI: [id number]                         Jetty Duhamel Diplomate, American Board of Sleep Medicine  ELECTRONICALLY SIGNED ON:  02/26/2019, 1:01 PM [facility] PH: [phone]   FX: [phone] ACCREDITED BY THE AMERICAN ACADEMY OF SLEEP MEDICINE

## 2019-02-27 ENCOUNTER — Encounter: Payer: Self-pay | Admitting: Family Medicine

## 2019-02-27 DIAGNOSIS — G4733 Obstructive sleep apnea (adult) (pediatric): Secondary | ICD-10-CM

## 2019-02-27 HISTORY — DX: Obstructive sleep apnea (adult) (pediatric): G47.33

## 2019-02-28 ENCOUNTER — Other Ambulatory Visit: Payer: Self-pay

## 2019-02-28 DIAGNOSIS — G4730 Sleep apnea, unspecified: Secondary | ICD-10-CM

## 2019-03-02 ENCOUNTER — Other Ambulatory Visit: Payer: Self-pay

## 2019-03-02 ENCOUNTER — Ambulatory Visit: Payer: BLUE CROSS/BLUE SHIELD | Admitting: Family Medicine

## 2019-04-12 ENCOUNTER — Ambulatory Visit (INDEPENDENT_AMBULATORY_CARE_PROVIDER_SITE_OTHER): Payer: BLUE CROSS/BLUE SHIELD | Admitting: Family Medicine

## 2019-04-12 ENCOUNTER — Encounter: Payer: Self-pay | Admitting: Family Medicine

## 2019-04-12 ENCOUNTER — Other Ambulatory Visit: Payer: Self-pay

## 2019-04-12 VITALS — BP 140/90 | HR 77 | Temp 97.5°F | Wt 240.8 lb

## 2019-04-12 DIAGNOSIS — F419 Anxiety disorder, unspecified: Secondary | ICD-10-CM

## 2019-04-12 DIAGNOSIS — G4733 Obstructive sleep apnea (adult) (pediatric): Secondary | ICD-10-CM | POA: Diagnosis not present

## 2019-04-12 DIAGNOSIS — E782 Mixed hyperlipidemia: Secondary | ICD-10-CM

## 2019-04-12 DIAGNOSIS — I10 Essential (primary) hypertension: Secondary | ICD-10-CM | POA: Diagnosis not present

## 2019-04-12 MED ORDER — LISINOPRIL-HYDROCHLOROTHIAZIDE 20-12.5 MG PO TABS: 1.0000 | ORAL_TABLET | Freq: Every day | ORAL | 2 refills | Status: DC

## 2019-04-12 NOTE — Patient Instructions (Addendum)
For your CPAP: [facility] [address] [place], [place] [address] [phone]  Your blood pressure today is 140/90 and this is closer to goal range.  Stop the lisinopril and start on lisinopril/HCTZ for your blood pressure.  If you are not seeing readings less than 130/80 on a regular basis, let me know.  Make sure you are limiting your salt intake.  Less than 2,400 mg of sodium per day would be ideal.  Try to get at least 150 minutes of physical activity per week.  Continue on the daily atorvastatin and citalopram.  I will see you back 4 weeks after you start using the CPAP machine.

## 2019-04-12 NOTE — Progress Notes (Signed)
   Subjective:    Patient ID: Zachary Schultz, male    DOB: 1981/10/28, 38 y.o.   MRN: [id number]  HPI Chief Complaint  Patient presents with  . 3 month follow-up    3 month follow-up on HTN, Lipids- not fasting, tomorrow picking up cpap machine   He is here for 25-month follow-up on hypertension and other chronic health conditions.  Reports good daily compliance with medication although states he ran out of lisinopril-HCTZ last week so he switched to lisinopril 20 mg which he also had at home.  States blood pressures are consistently 130s to 150s/80s Diet has been poor since being at home more with the pandemic.   States he has an appt to pick up CPAP this week.   HL- statin daily without any concerns   Anxiety- taking Celexa 10 mg daily. States 20 mg is too sedating and he feels like a "zombie". States his irritability has improved.   Reports drinking less alcohol and has not had any hard liquor in months. Drinks beer up to 6 (sometimes more) only a few days per week and not daily. This is an improvement. He reports feeling better overall.  He was attending AA meetings before the Covid-19 pandemic.   Denies fever, chills, dizziness, chest pain, palpitations, shortness of breath, abdominal pain, N/V/D, urinary symptoms, LE edema.   Reviewed allergies, medications, past medical, surgical, family, and social history.    Review of Systems Pertinent positives and negatives in the history of present illness.     Objective:   Physical Exam BP 140/90   Pulse 77   Temp (!) 97.5 F (36.4 C) (Oral)   Wt 240 lb 12.8 oz (109.2 kg)   SpO2 98%   BMI 37.16 kg/m   Alert and oriented and in no distress.  Cardiac exam shows a regular rhythm without murmurs or gallops. Lungs are clear to auscultation. Extremities without edema. Normal mood and thought process. Skin is warm and dry.       Assessment & Plan:  Uncontrolled hypertension - Plan: lisinopril-hydrochlorothiazide (ZESTORETIC) 20-12.5  MG tablet  OSA (obstructive sleep apnea)  Mixed hyperlipidemia  Anxiety  BP has improved. Continue on lisinopril-HCTZ and eat a low sodium diet. Continue regular checks of BP.  Increase exercise.  Congratulated him on cutting back on alcohol use and going to [employer] No longer drinking liquor. Occasionally has beer and does not overdo.  Anxiety improved but only taking 10 mg citalopram since 20 mg makes him feel sedated and "zombie-like" Continue on daily statin. Doing fine with this medication. He is not fasting today.  OSA and has appointment to get CPAP this week. Will follow up 4 weeks after consistent CPAP use.

## 2019-07-28 ENCOUNTER — Other Ambulatory Visit: Payer: Self-pay | Admitting: Internal Medicine

## 2019-07-28 ENCOUNTER — Ambulatory Visit (INDEPENDENT_AMBULATORY_CARE_PROVIDER_SITE_OTHER): Payer: BLUE CROSS/BLUE SHIELD | Admitting: Family Medicine

## 2019-07-28 ENCOUNTER — Encounter: Payer: Self-pay | Admitting: Family Medicine

## 2019-07-28 ENCOUNTER — Other Ambulatory Visit: Payer: Self-pay

## 2019-07-28 VITALS — BP 120/82 | HR 102 | Temp 97.8°F | Resp 16 | Wt 236.8 lb

## 2019-07-28 DIAGNOSIS — T675XXA Heat exhaustion, unspecified, initial encounter: Secondary | ICD-10-CM

## 2019-07-28 DIAGNOSIS — R55 Syncope and collapse: Secondary | ICD-10-CM

## 2019-07-28 DIAGNOSIS — R42 Dizziness and giddiness: Secondary | ICD-10-CM | POA: Diagnosis not present

## 2019-07-28 DIAGNOSIS — I951 Orthostatic hypotension: Secondary | ICD-10-CM

## 2019-07-28 DIAGNOSIS — R7989 Other specified abnormal findings of blood chemistry: Secondary | ICD-10-CM

## 2019-07-28 DIAGNOSIS — R61 Generalized hyperhidrosis: Secondary | ICD-10-CM

## 2019-07-28 DIAGNOSIS — R519 Headache, unspecified: Secondary | ICD-10-CM

## 2019-07-28 DIAGNOSIS — R51 Headache: Secondary | ICD-10-CM

## 2019-07-28 LAB — COMPREHENSIVE METABOLIC PANEL
ALT: 53 IU/L — ABNORMAL HIGH (ref 0–44)
AST: 37 IU/L (ref 0–40)
Albumin/Globulin Ratio: 2 (ref 1.2–2.2)
Albumin: 5.2 g/dL — ABNORMAL HIGH (ref 4.0–5.0)
Alkaline Phosphatase: 85 IU/L (ref 39–117)
BUN/Creatinine Ratio: 17 (ref 9–20)
BUN: 26 mg/dL — ABNORMAL HIGH (ref 6–20)
Bilirubin Total: 1 mg/dL (ref 0.0–1.2)
CO2: 27 mmol/L (ref 20–29)
Calcium: 10.2 mg/dL (ref 8.7–10.2)
Chloride: 97 mmol/L (ref 96–106)
Creatinine, Ser: 1.53 mg/dL — ABNORMAL HIGH (ref 0.76–1.27)
GFR calc Af Amer: 66 mL/min/{1.73_m2} (ref >59)
GFR calc non Af Amer: 57 mL/min/{1.73_m2} — ABNORMAL LOW (ref >59)
Globulin, Total: 2.6 g/dL (ref 1.5–4.5)
Glucose: 114 mg/dL — ABNORMAL HIGH (ref 65–99)
Potassium: 4.3 mmol/L (ref 3.5–5.2)
Sodium: 134 mmol/L (ref 134–144)
Total Protein: 7.8 g/dL (ref 6.0–8.5)

## 2019-07-28 LAB — POCT URINALYSIS DIP (PROADVANTAGE DEVICE)
Blood, UA: NEGATIVE
Glucose, UA: NEGATIVE mg/dL
Ketones, POC UA: NEGATIVE mg/dL
Nitrite, UA: NEGATIVE
Specific Gravity, Urine: 1.03
Urobilinogen, Ur: NEGATIVE
pH, UA: 6 (ref 5.0–8.0)

## 2019-07-28 LAB — CBC WITH DIFFERENTIAL/PLATELET
Basophils Absolute: 0 10*3/uL (ref 0.0–0.2)
Basos: 0 %
EOS (ABSOLUTE): 0.1 10*3/uL (ref 0.0–0.4)
Eos: 1 %
Hematocrit: 47.1 % (ref 37.5–51.0)
Hemoglobin: 16.5 g/dL (ref 13.0–17.7)
Lymphocytes Absolute: 2 10*3/uL (ref 0.7–3.1)
Lymphs: 18 %
MCH: 31.5 pg (ref 26.6–33.0)
MCHC: 35 g/dL (ref 31.5–35.7)
MCV: 90 fL (ref 79–97)
Monocytes Absolute: 1 10*3/uL — ABNORMAL HIGH (ref 0.1–0.9)
Monocytes: 9 %
Neutrophils Absolute: 8 10*3/uL — ABNORMAL HIGH (ref 1.4–7.0)
Neutrophils: 72 %
Platelets: 225 10*3/uL (ref 150–450)
RBC: 5.24 x10E6/uL (ref 4.14–5.80)
RDW: 13.6 % (ref 11.6–15.4)
WBC: 11.1 10*3/uL — ABNORMAL HIGH (ref 3.4–10.8)

## 2019-07-28 LAB — POCT GLYCOSYLATED HEMOGLOBIN (HGB A1C): Hemoglobin A1C: 5.8 % — AB (ref 4.0–5.6)

## 2019-07-28 LAB — POCT CBG (FASTING - GLUCOSE)-MANUAL ENTRY: Glucose Fasting, POC: 122 mg/dL — AB (ref 70–99)

## 2019-07-28 NOTE — Progress Notes (Signed)
Subjective:    Patient ID: Zachary Schultz, male    DOB: [birth year], 38 y.o.   MRN: [id number]  HPI Chief Complaint  Patient presents with  . sweating    sweating, dizziness, tired, SOB at night, not using cpap all the time because he moves around at night, trouble sleeping,  headaches from not using cpap, BP was 160/111 last weekend   Here with complaints of dizziness, diaphoresis, headache, and severe fatigue. States yesterday he had a near syncopal episode.  Reports working outside in the heat.  Yesterday he stood up quickly and became dizzy, his vision was almost completely black and he could not hear.  He did not pass out or fall.  States he sat down and drank water and felt some better. Today he is also been working out in the heat.  His job is a Administratorlandscaper.  States he has not eaten today.  He has not urinated today either.  He is visibly diaphoretic and uncomfortable.  Denies fever, body aches, chest pain, palpitations, shortness of breath, abdominal pain, nausea, vomiting, diarrhea.   Review of Systems Pertinent positives and negatives in the history of present illness.     Objective:   Physical Exam Constitutional:      Appearance: He is ill-appearing and diaphoretic.  HENT:     Mouth/Throat:     Mouth: Mucous membranes are dry.  Eyes:     General: No visual field deficit.    Extraocular Movements: Extraocular movements intact.     Conjunctiva/sclera: Conjunctivae normal.     Pupils: Pupils are equal, round, and reactive to light.  Neck:     Musculoskeletal: Normal range of motion and neck supple.  Cardiovascular:     Rate and Rhythm: Normal rate and regular rhythm.     Pulses: Normal pulses.  Pulmonary:     Effort: Pulmonary effort is normal.     Breath sounds: Normal breath sounds.  Musculoskeletal: Normal range of motion.  Lymphadenopathy:     Cervical: No cervical adenopathy.  Skin:    General: Skin is warm.     Capillary Refill: Capillary refill takes less  than 2 seconds.  Neurological:     General: No focal deficit present.     Mental Status: He is alert and oriented to person, place, and time.     Cranial Nerves: Cranial nerves are intact. No facial asymmetry.     Sensory: Sensation is intact.     Motor: Motor function is intact.     Coordination: Coordination is intact.     Gait: Gait is intact.     Deep Tendon Reflexes: Reflexes normal.  Psychiatric:        Mood and Affect: Mood normal.        Thought Content: Thought content normal.    BP 120/82 (BP Location: Right Arm, Patient Position: Standing, Cuff Size: Large)   Pulse (!) 102   Temp 97.8 F (36.6 C)   Resp 16   Wt 236 lb 12.8 oz (107.4 kg)   SpO2 95%   BMI 36.54 kg/m       Assessment & Plan:  Heat exhaustion, initial encounter - Plan: Comprehensive metabolic panel, CBC with Differential/Platelet, EKG 12-Lead, POCT Urinalysis DIP (Proadvantage Device)  Dizziness - Plan: Comprehensive metabolic panel, CBC with Differential/Platelet, HgB A1c, Glucose (CBG), Fasting  Diaphoresis - Plan: Comprehensive metabolic panel, CBC with Differential/Platelet  Near syncope - Plan: Comprehensive metabolic panel, CBC with Differential/Platelet, EKG 12-Lead, POCT Urinalysis DIP (Proadvantage Device)  Acute nonintractable headache, unspecified headache type  Orthostatic hypotension  Orthostatic hypotension upon arrival. He was visibly uncomfortable, diaphoretic. We had him remove his shirt, drink water and lie down in a recumbent position.  EKG unremarkable.  Blood sugar 114 fasting. Hgb A1c 5.8% Stat CBC, CMP ordered  After 3 cups of water he reported feeling much better.  Refused transfer to ED.  His wife was called to come get him.  Discussed that his symptoms are consistent with heat exhaustion and that he is fortunate to have not had a heat stroke.  Counseling on signs of being overheated and when to seek air conditioning or shade and how to tell hydration status.  Repeat  orthostatic vitals improved after hydration and monitoring him for one hour.  Strict precautions to have his wife drive him home, stay in cool temperature, hydrate and rest. Avoid alcohol.  If he cannot keep fluids down or if he does not see his urine becoming lighter over the course of the afternoon, he will go to the ED for IV hydration.  Advised that if his labs are significantly abnormal that I will call him to go to the ED as well.  Follow up tomorrow virtually to re-assess.

## 2019-07-28 NOTE — Patient Instructions (Signed)
Go home and rest in an air conditioned room.  Drink several more cups of water today, not all at one time.   If you develop severe headache, dizziness, vision changes or if you are unable to keep down fluids, you will need to call 911 or go to the emergency department.   I will follow up with you tomorrow.       Preventing Heat Exhaustion, Adult Heat exhaustion happens when your body gets too hot (overheated) from hot weather or from exercise. Untreated heat exhaustion could lead to heat stroke. Heat stroke can be deadly. How can heat exhaustion affect me? Early warning signs of heat exhaustion are:  Weakness.  Fatigue.  Stomach cramps.  Arm pain.  Leg cramps. Later symptoms of heat exhaustion include:  Heavy sweating.  Clammy skin.  Rapid, weak pulse.  Nausea or vomiting.  Dizziness.  Headache.  Fainting. If you have signs and symptoms of heat exhaustion, move to a cool place, loosen your clothing, and drink water or a sports drink. Then, put cool, wet compresses on your body or get into a cool bath or shower. What can increase my risk? People who work or exercise outside in hot weather have the highest risk of heat exhaustion. You may also be at higher risk if you:  Are over age 22. Older adults have a greater risk for heat exhaustion than younger adults.  Are overweight.  Have high blood pressure.  Have heart disease. What actions can I take to prevent heat exhaustion?      Avoid being outside on very hot days. Check your local news for extreme heat alerts or warnings.  In extreme heat, stay in an air-conditioned environment until the temperature cools off.  Check with your health care provider before starting any new exercise or activity. Ask about any health conditions or medicines that might increase your risk for heat exhaustion.  Wear lightweight, light-colored, and loose-fitting clothing in warm weather.  Do outdoor activities when it is  cooler. This may be in the morning, late afternoon, or evening. Take breaks in the shade.  Do not work or exercise in the heat when you feel unwell or have been sick.  Start any new work or exercise activity gradually.  Protect yourself from the sun by wearing a broad-brimmed hat and using at least SPF 15 broad-spectrum sunscreen.  Drink enough water or sports drink to keep your urine pale yellow. When it is hot, drink every 15 to 20 minutes, even if you are not thirsty.  Do not go out in the heat after a heavy meal.  Do not drink alcohol or caffeinated drinks when it is very hot outside.  If you have friends or family members who are older adults: ? Do not leave an older adult alone in a hot car. ? Make sure older adults have access to air-conditioning on very hot days. Remind them to drink enough fluids. Check on them at least twice a day if you can. Where to find more information  Centers for Disease Control and Prevention (CDC): PurpleGadgets.be  American Academy of Family Physicians (AAFP): https://familydoctor.org/condition/heat-exhaustion-heatstroke  Optometrist (AAOS): https://orthoinfo.aaos.org/en/diseases--conditions/heat-injury-and-heat-exhaustion Contact a health care provider if:  You faint.  You feel weak or dizzy.  You have any signs or symptoms of heat exhaustion that last more than one hour. Get help right away if you have signs of heat stroke:  Body temperature of 103F (39.4C) or higher.  Hot, dry, red skin.  Fast, thumping pulse.  Confusion.  Loss of consciousness. Summary  Heat exhaustion happens when your body gets overheated and cannot cool down.  Avoid being outside on very hot days. Check your local news for extreme heat alerts or warnings. When you are out in the heat, take steps to protect yourself from the sun and stay hydrated.  If you have signs or symptoms of heat  exhaustion, get out of the heat, drink fluids, take steps to cool down, and contact a health care provider.  Get help right away if you have signs or symptoms of heat stroke. This information is not intended to replace advice given to you by your health care provider. Make sure you discuss any questions you have with your health care provider. Document Released: 02/17/2018 Document Revised: 02/17/2018 Document Reviewed: 02/17/2018 Elsevier Patient Education  2020 [employer]

## 2019-07-29 ENCOUNTER — Ambulatory Visit: Payer: BLUE CROSS/BLUE SHIELD | Admitting: Family Medicine

## 2019-07-29 ENCOUNTER — Encounter: Payer: Self-pay | Admitting: Family Medicine

## 2019-07-29 VITALS — BP 128/85 | Ht 66.0 in | Wt 236.0 lb

## 2019-07-29 DIAGNOSIS — R42 Dizziness and giddiness: Secondary | ICD-10-CM

## 2019-07-29 DIAGNOSIS — Z09 Encounter for follow-up examination after completed treatment for conditions other than malignant neoplasm: Secondary | ICD-10-CM

## 2019-07-29 NOTE — Progress Notes (Signed)
   Subjective:   Documentation for virtual audio and video telecommunications through [employer] encounter:   The patient was located at home.  2 patient identifiers used The provider was located in the office. The patient did consent to this visit and is aware of possible charges through their insurance for this visit.  The other persons participating in this telemedicine service were none.    Patient ID: Zachary Schultz, male    DOB: [birth year], 38 y.o.   MRN: [id number]  HPI Chief Complaint  Patient presents with  . Follow-up    heat exhaustion/feeling better (relaxing)   This is a 24-hour follow-up regarding symptoms of heat exhaustion yesterday.  He reports feeling much better today.  He has been hydrating and notes his urine is much lighter today.  Denies dizziness or headache.  He does still have some fatigue. He does plan to take the rest of the weekend off per my recommendation to rest. He will go back to work Tuesday.  No new symptoms.   Review of Systems Pertinent positives and negatives in the history of present illness.     Objective:   Physical Exam BP 128/85   Ht 5\' 6"  (1.676 m)   Wt 236 lb (107 kg)   BMI 38.09 kg/m   Alert and oriented and in no acute distress.      Assessment & Plan:  Dizziness  Follow up  He reports feeling much better today.  Continue to hydrate and rest over the weekend.  He will go back to work Tuesday.  Discussed that his kidney function was elevated yesterday due to dehydration and we will recheck this next week along with a repeat urinalysis.

## 2019-08-02 ENCOUNTER — Other Ambulatory Visit: Payer: Self-pay

## 2019-08-02 ENCOUNTER — Other Ambulatory Visit: Payer: BLUE CROSS/BLUE SHIELD

## 2019-08-02 ENCOUNTER — Other Ambulatory Visit (INDEPENDENT_AMBULATORY_CARE_PROVIDER_SITE_OTHER): Payer: BLUE CROSS/BLUE SHIELD

## 2019-08-02 DIAGNOSIS — R7989 Other specified abnormal findings of blood chemistry: Secondary | ICD-10-CM | POA: Diagnosis not present

## 2019-08-02 DIAGNOSIS — R8290 Unspecified abnormal findings in urine: Secondary | ICD-10-CM | POA: Diagnosis not present

## 2019-08-02 LAB — POCT URINALYSIS DIP (PROADVANTAGE DEVICE)
Bilirubin, UA: NEGATIVE
Blood, UA: NEGATIVE
Glucose, UA: NEGATIVE mg/dL
Nitrite, UA: NEGATIVE
Protein Ur, POC: NEGATIVE mg/dL
Specific Gravity, Urine: 1.025
Urobilinogen, Ur: NEGATIVE
pH, UA: 6 (ref 5.0–8.0)

## 2019-08-03 ENCOUNTER — Other Ambulatory Visit: Payer: BLUE CROSS/BLUE SHIELD

## 2019-08-03 LAB — COMPREHENSIVE METABOLIC PANEL
ALT: 41 IU/L (ref 0–44)
AST: 28 IU/L (ref 0–40)
Albumin/Globulin Ratio: 1.8 (ref 1.2–2.2)
Albumin: 4.4 g/dL (ref 4.0–5.0)
Alkaline Phosphatase: 85 IU/L (ref 39–117)
BUN/Creatinine Ratio: 17 (ref 9–20)
BUN: 15 mg/dL (ref 6–20)
Bilirubin Total: 0.3 mg/dL (ref 0.0–1.2)
CO2: 23 mmol/L (ref 20–29)
Calcium: 9.2 mg/dL (ref 8.7–10.2)
Chloride: 102 mmol/L (ref 96–106)
Creatinine, Ser: 0.88 mg/dL (ref 0.76–1.27)
GFR calc Af Amer: 127 mL/min/{1.73_m2} (ref >59)
GFR calc non Af Amer: 110 mL/min/{1.73_m2} (ref >59)
Globulin, Total: 2.5 g/dL (ref 1.5–4.5)
Glucose: 98 mg/dL (ref 65–99)
Potassium: 4 mmol/L (ref 3.5–5.2)
Sodium: 140 mmol/L (ref 134–144)
Total Protein: 6.9 g/dL (ref 6.0–8.5)

## 2020-01-06 ENCOUNTER — Ambulatory Visit: Payer: BLUE CROSS/BLUE SHIELD | Admitting: Family Medicine

## 2020-01-09 ENCOUNTER — Ambulatory Visit (INDEPENDENT_AMBULATORY_CARE_PROVIDER_SITE_OTHER): Payer: BC Managed Care – PPO | Admitting: Family Medicine

## 2020-01-09 ENCOUNTER — Encounter: Payer: Self-pay | Admitting: Family Medicine

## 2020-01-09 VITALS — BP 120/82 | HR 101 | Temp 98.6°F | Wt 252.8 lb

## 2020-01-09 DIAGNOSIS — I10 Essential (primary) hypertension: Secondary | ICD-10-CM

## 2020-01-09 DIAGNOSIS — F1020 Alcohol dependence, uncomplicated: Secondary | ICD-10-CM

## 2020-01-09 DIAGNOSIS — K219 Gastro-esophageal reflux disease without esophagitis: Secondary | ICD-10-CM

## 2020-01-09 DIAGNOSIS — G4733 Obstructive sleep apnea (adult) (pediatric): Secondary | ICD-10-CM | POA: Diagnosis not present

## 2020-01-09 DIAGNOSIS — R7303 Prediabetes: Secondary | ICD-10-CM | POA: Diagnosis not present

## 2020-01-09 DIAGNOSIS — R4586 Emotional lability: Secondary | ICD-10-CM

## 2020-01-09 NOTE — Patient Instructions (Addendum)
Dr. Milagros Evener, MD- Psych Address: [address] [place], [place] [address] Phone: [phone]   Try using a free app such as My Fitness Pal.  Track your calories using this.   Get at least 30 minutes of vigorous exercise per day.   Start back on lisinopril-HCTZ once daily and the atorvastatin

## 2020-01-09 NOTE — Progress Notes (Signed)
Subjective:    Patient ID: Zachary Schultz, male    DOB: 05/07/1981, 39 y.o.   MRN: [id number]  HPI Chief Complaint  Patient presents with  . med check    not been on meds for a month. stopped drinking for a month then drank 10 beers last weekend.    Here for follow up on HTN and other chronic health conditions.   States he has been off of his medications for the past month. No real reason as to why he stopped, he has the medication.  He was taking lisinopril-HCTZ prior to stopping on his own. BP was doing fine per patient and no side effects.   States his main concern today is stress, anxiety and having a significant amount of frustration with his home life. 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 She now is commuting to a new job in [place].  States his 43 year old son is "out of control".   He was taking citalopram but stopped because he could not tell a difference when he was taking it.   History of binge drinking. He reports drinking less often. States he did not drink at all for one month but then he drank several beers last weekend. States he drinks to "escape".   States he has a history of mood issues, anger issues. States his brother has bipolar disorder. States he does not think he has bipolar.  Denies SI or HI.  States he has seen therapists in the past but it did not help.  States he and his wife have been to marriage counseling. No longer doing this.  Feels that his marriage is suffering because they do not make time for each other.   Covid has impacted his job and his ability to travel to [place] to see  family. He wants to travel, to take vacation.   Complains of feeling "fat". States he overeats and eats unhealthy food now that he is not drinking alcohol as often.  He eats heavy meals as bedtime and lays down.  Issues with reflux. Is not taking anything for reflux.   OSA- reports  using CPAP and doing fine with it.   Hx of prediabetes. Weight gain now. Needs recheck of A1c.   HL- has not been taking his statin. No real reason. States he will start back on it if needed.    Denies fever, chills, dizziness, chest pain, palpitations, shortness of breath, abdominal pain, N/V/D, urinary symptoms, LE edema.   Reviewed allergies, medications, past medical, surgical, family, and social history.    Review of Systems Pertinent positives and negatives in the history of present illness.     Objective:   Physical Exam BP 120/82   Pulse (!) 101   Temp 98.6 F (37 C)   Wt 252 lb 12.8 oz (114.7 kg)   BMI 40.80 kg/m   Alert and in no distress.  Cardiac exam shows a regular  rhythm without murmurs or gallops. Lungs are clear to auscultation. Skin is warm and dry. Mood is slightly anxious but hopeful. Cooperative.        Assessment & Plan:  Essential hypertension - Plan: CBC with Differential/Platelet, Comprehensive metabolic panel -recommend he start back on lisinopril/HCTZ, there is no real reason as to why he stopped the medication..  Recommend eating a healthier diet and increasing physical activity.  He will keep an eye on  his blood pressures and let me know if his readings are not consistently in goal range.  Morbid obesity (HCC) - Plan: TSH, Lipid panel -Counseled on the fact that he has had significant weight gain since his last visit.  He seems to be eating more now that he is not drinking all alcohol as often.  Discussed that he is using food now to self soothe.  Encouraged healthier habits and try using a free app such as My Fitness Pal to help him keep track of how many calories he is eating.  Recommend he talk to a counselor about his overeating now since cutting back on alcohol.  I did challenge him to losing 1 pound a week and I will see him back in 4 weeks.  OSA (obstructive sleep apnea) -Reportedly is using this nightly and doing well with  it.  Prediabetes - Plan: Hemoglobin A1c -Counseling done on healthy diet and exercise.  Follow-up pending results.   Gastroesophageal reflux disease, unspecified whether esophagitis present -Discussed lifestyle management for GERD.  Encouraged him to avoid eating and laying down and also to avoid overeating.  Recommend he avoid foods that trigger his reflux and avoid alcohol.  He may try taking an antacid if needed.  Follow-up in 4 weeks.  Alcohol dependence, binge pattern (HCC) -He seems to be doing better by not drinking as often however he still is consuming significant amount of alcohol when he does drink.  Referral to psychiatry and counseling.  Mood changes -He will stop Celexa since this was not working for him.  He does not appear to be in any danger and denies thoughts of self-harm.  His brother has bipolar disorder.  Referral to psychiatry and therapy. Follow up in 4 weeks.

## 2020-01-10 LAB — CBC WITH DIFFERENTIAL/PLATELET
Basophils Absolute: 0.1 10*3/uL (ref 0.0–0.2)
Basos: 1 %
EOS (ABSOLUTE): 0.2 10*3/uL (ref 0.0–0.4)
Eos: 3 %
Hematocrit: 46.8 % (ref 37.5–51.0)
Hemoglobin: 16.3 g/dL (ref 13.0–17.7)
Immature Grans (Abs): 0 10*3/uL (ref 0.0–0.1)
Immature Granulocytes: 0 %
Lymphocytes Absolute: 1.8 10*3/uL (ref 0.7–3.1)
Lymphs: 27 %
MCH: 30.8 pg (ref 26.6–33.0)
MCHC: 34.8 g/dL (ref 31.5–35.7)
MCV: 89 fL (ref 79–97)
Monocytes Absolute: 0.7 10*3/uL (ref 0.1–0.9)
Monocytes: 11 %
Neutrophils Absolute: 3.8 10*3/uL (ref 1.4–7.0)
Neutrophils: 58 %
Platelets: 212 10*3/uL (ref 150–450)
RBC: 5.29 x10E6/uL (ref 4.14–5.80)
RDW: 12.6 % (ref 11.6–15.4)
WBC: 6.6 10*3/uL (ref 3.4–10.8)

## 2020-01-10 LAB — COMPREHENSIVE METABOLIC PANEL
ALT: 36 IU/L (ref 0–44)
AST: 23 IU/L (ref 0–40)
Albumin/Globulin Ratio: 1.6 (ref 1.2–2.2)
Albumin: 4.6 g/dL (ref 4.0–5.0)
Alkaline Phosphatase: 95 IU/L (ref 39–117)
BUN/Creatinine Ratio: 15 (ref 9–20)
BUN: 13 mg/dL (ref 6–20)
Bilirubin Total: 0.5 mg/dL (ref 0.0–1.2)
CO2: 22 mmol/L (ref 20–29)
Calcium: 9.7 mg/dL (ref 8.7–10.2)
Chloride: 100 mmol/L (ref 96–106)
Creatinine, Ser: 0.85 mg/dL (ref 0.76–1.27)
GFR calc Af Amer: 128 mL/min/{1.73_m2} (ref >59)
GFR calc non Af Amer: 111 mL/min/{1.73_m2} (ref >59)
Globulin, Total: 2.8 g/dL (ref 1.5–4.5)
Glucose: 86 mg/dL (ref 65–99)
Potassium: 4.8 mmol/L (ref 3.5–5.2)
Sodium: 137 mmol/L (ref 134–144)
Total Protein: 7.4 g/dL (ref 6.0–8.5)

## 2020-01-10 LAB — LIPID PANEL
Chol/HDL Ratio: 6.6 ratio — ABNORMAL HIGH (ref 0.0–5.0)
Cholesterol, Total: 250 mg/dL — ABNORMAL HIGH (ref 100–199)
HDL: 38 mg/dL — ABNORMAL LOW (ref >39)
LDL Chol Calc (NIH): 135 mg/dL — ABNORMAL HIGH (ref 0–99)
Triglycerides: 421 mg/dL — ABNORMAL HIGH (ref 0–149)
VLDL Cholesterol Cal: 77 mg/dL — ABNORMAL HIGH (ref 5–40)

## 2020-01-10 LAB — HEMOGLOBIN A1C
Est. average glucose Bld gHb Est-mCnc: 120 mg/dL
Hgb A1c MFr Bld: 5.8 % — ABNORMAL HIGH (ref 4.8–5.6)

## 2020-01-10 LAB — TSH: TSH: 2.32 u[IU]/mL (ref 0.450–4.500)

## 2020-01-10 NOTE — Progress Notes (Signed)
His cholesterol and triglycerides are significantly elevated which increases his risk of heart disease. I recommend he start back on the statin as we discussed yesterday and eat foods lower in cholesterol and fat. He is still in the prediabetes range. I will see him back in 4 weeks as discussed.

## 2020-02-07 NOTE — Progress Notes (Deleted)
   Subjective:    Patient ID: Zachary Schultz, male    DOB: 1981-11-20, 39 y.o.   MRN: [id number]  HPI No chief complaint on file.  HTN- started back on medication   Anxiety-   Obesity-   Alcohol-   HL and trigs-   Review of Systems Pertinent positives and negatives in the history of present illness.     Objective:   Physical Exam There were no vitals taken for this visit.        Assessment & Plan:  Essential hypertension  Anxiety  Alcohol consumption binge drinking  Obesity (BMI 30-39.9)  Elevated triglycerides with high cholesterol

## 2020-02-08 ENCOUNTER — Ambulatory Visit: Payer: BC Managed Care – PPO | Admitting: Family Medicine

## 2020-02-12 ENCOUNTER — Other Ambulatory Visit: Payer: Self-pay | Admitting: Family Medicine

## 2020-02-12 DIAGNOSIS — I10 Essential (primary) hypertension: Secondary | ICD-10-CM

## 2020-02-13 ENCOUNTER — Ambulatory Visit: Payer: BC Managed Care – PPO | Admitting: Family Medicine

## 2020-02-13 ENCOUNTER — Encounter: Payer: Self-pay | Admitting: Family Medicine

## 2020-02-13 ENCOUNTER — Other Ambulatory Visit: Payer: Self-pay

## 2020-02-13 VITALS — BP 138/88 | HR 85 | Temp 98.2°F | Wt 257.0 lb

## 2020-02-13 DIAGNOSIS — G479 Sleep disorder, unspecified: Secondary | ICD-10-CM

## 2020-02-13 DIAGNOSIS — G4733 Obstructive sleep apnea (adult) (pediatric): Secondary | ICD-10-CM

## 2020-02-13 DIAGNOSIS — Z91199 Patient's noncompliance with other medical treatment and regimen due to unspecified reason: Secondary | ICD-10-CM

## 2020-02-13 DIAGNOSIS — Z9119 Patient's noncompliance with other medical treatment and regimen: Secondary | ICD-10-CM

## 2020-02-13 DIAGNOSIS — I10 Essential (primary) hypertension: Secondary | ICD-10-CM | POA: Diagnosis not present

## 2020-02-13 DIAGNOSIS — F1020 Alcohol dependence, uncomplicated: Secondary | ICD-10-CM

## 2020-02-13 DIAGNOSIS — F419 Anxiety disorder, unspecified: Secondary | ICD-10-CM

## 2020-02-13 DIAGNOSIS — R4586 Emotional lability: Secondary | ICD-10-CM

## 2020-02-13 NOTE — Progress Notes (Signed)
   Subjective:    Patient ID: Zachary Schultz, male    DOB: Nov 01, 1981, 39 y.o.   MRN: [id number]  HPI Chief Complaint  Patient presents with  . follow-up on HTN    bp running 140/94   He is here to follow up on HTN. Reports taking lisinopril- HCTZ and no issues. BP at home still high but improving.  He is still drinking alcohol and his diet is poor. He is not exercising.   States he is having trouble sleeping. He moves a lot in his sleep. Wears his CPAP but it is bothersome because he likes to sleep on his stomach sometimes.  States his sleep when he has been drinking is very poor.   States he is having nightmares. Dreams he dies and has to say goodbye to his son.   Reports he is taking daily Celexa but at his previous visit we decided he would stop it since he was not noticing any improvement.  He did not call and schedule with a psychiatrist as I strongly encouraged him to do.  Trying to reduce job stress. Hired a Diplomatic Services operational officer to help him. He still has issues with mood swings. Some days he feels that everything is good and he can handle it all but then other days he feels too stressed and overwhelmed. States he is tired of feeling "fat".  States he knows what he needs to do by not drinking, getting more sleep, eating healthy and exercising and he does not need anyone to tell him that but the problem as he cannot get motivated to do at all. He also complains of decreased sexual performance, "not been able to last as long"  Denies fever, chills, dizziness, chest pain, palpitations, shortness of breath, abdominal pain, N/V/D, urinary symptoms, LE edema.   Denies fever, chills, dizziness, chest pain, palpitations, shortness of breath, abdominal pain, N/V/D, urinary symptoms, LE edema.   Reviewed allergies, medications, past medical, surgical, family, and social history.   Review of Systems Pertinent positives and negatives in the history of present illness.     Objective:   Physical  Exam BP 138/88   Pulse 85   Temp 98.2 F (36.8 C)   Wt 257 lb (116.6 kg)   BMI 41.48 kg/m   Alert and oriented and in no acute distress.  Respirations unlabored.  His mood is anxious.  Not otherwise examined      Assessment & Plan:  Essential hypertension  OSA (obstructive sleep apnea)  Alcohol dependence, binge pattern (HCC)  Sleep disorder  Anxiety  Mood changes  Personal history of noncompliance with medical treatment, presenting hazards to health  Discussed that his blood pressure is still elevated and that I strongly recommend he stop using alcohol.  He does not seem to be motivated to do this.  He is not going to [employer] and has not scheduled with a therapist or psychiatrist.  Discussed that I am concerned that he is self-medicating with the alcohol.  He admits today that he is having nightmares.  Continues having mood swings.  Family history of bipolar. Discussed that his health is suffering greatly due to his lifestyle. I once again gave him the name of a psychiatrist and he agrees to call and schedule an appointment.  Discussed that there is not much more that I can do at this point unless he decides to get professional mental health and work on alcohol cessation.  He verbalized understanding

## 2020-02-13 NOTE — Patient Instructions (Signed)
Dr. Milagros Evener, MD- Psych Address: [address] [place], [place] [address] Phone: [phone]

## 2020-02-13 NOTE — Telephone Encounter (Signed)
Pt has an appt today.  

## 2020-02-14 DIAGNOSIS — R4586 Emotional lability: Secondary | ICD-10-CM | POA: Insufficient documentation

## 2020-03-19 ENCOUNTER — Other Ambulatory Visit: Payer: Self-pay | Admitting: Family Medicine

## 2020-05-30 ENCOUNTER — Telehealth: Payer: BC Managed Care – PPO | Admitting: Family Medicine

## 2020-05-30 ENCOUNTER — Encounter: Payer: Self-pay | Admitting: Family Medicine

## 2020-05-30 ENCOUNTER — Other Ambulatory Visit: Payer: Self-pay

## 2020-05-30 VITALS — BP 152/94 | HR 118 | Wt 250.0 lb

## 2020-05-30 DIAGNOSIS — F419 Anxiety disorder, unspecified: Secondary | ICD-10-CM | POA: Diagnosis not present

## 2020-05-30 DIAGNOSIS — F1020 Alcohol dependence, uncomplicated: Secondary | ICD-10-CM

## 2020-05-30 DIAGNOSIS — R05 Cough: Secondary | ICD-10-CM

## 2020-05-30 DIAGNOSIS — R059 Cough, unspecified: Secondary | ICD-10-CM

## 2020-05-30 NOTE — Progress Notes (Signed)
   Subjective:  Documentation for virtual phone only. Declines video access.   The patient was located at home. 2 patient identifiers used.  The provider was located in the office. The patient did consent to this visit and is aware of possible charges through their insurance for this visit.  The other persons participating in this telemedicine service were none. Time spent on call was 13 minutes and in review of previous records 15 minutes total.  This virtual service is not related to other E/M service within previous 7 days.   Patient ID: Zachary Schultz, male    DOB: Feb 15, 1981, 39 y.o.   MRN: [id number]  HPI Chief Complaint  Patient presents with  . cough    cough x 4 days with phelgm, no other symptoms, dayquil and nquil.    Complains of cough productive of white sputum for the past 4 days. States he is improving. Has not been coughing this morning. He does have laryngitis. Denies rhinorrhea, sinus pain, PND, sore throat.  Is taking Dayquil and Nyquil.   States he drank 12 beers last night. Feels dehydrated. Pulse is elevated.  Denies fever, chills, dizziness, chest pain, palpitations, shortness of breath, abdominal pain, N/V/D.    States he saw his psychiatrist and started on new medication.  He bought 2 houses last week.  Wellbutrin and escitalopram prescribed   He did not get a covid vaccine.    Review of Systems Pertinent positives and negatives in the history of present illness.     Objective:   Physical Exam BP (!) 152/94   Pulse (!) 118   Wt 250 lb (113.4 kg)   BMI 40.35 kg/m   Alert and oriented and in no acute distress. Voice is hoarse. Speaking in complete sentences.       Assessment & Plan:  Cough  Anxiety  Alcohol dependence, binge pattern (HCC)  Reports his cough is improving and he is not coughing this morning.  Recommend increasing water intake especially since he had a night of binge drinking last night.  He may try Mucinex DM if needed.   Recommend he get a Covid test since he has not been vaccinated.  He is now under the care of Dr. Evelene Croon psychiatry for anxiety and mood changes.  Has an appointment with her later this month. He did not take his blood pressure medication last night and we discussed that his blood pressure is elevated as well as his pulse.  Recommend he avoid drinking alcohol in excess and that alcohol is dehydrating.  Recommend he follow-up with me in 4 weeks or sooner if needed.

## 2020-06-28 ENCOUNTER — Encounter: Payer: Self-pay | Admitting: Family Medicine

## 2020-06-28 ENCOUNTER — Ambulatory Visit: Payer: BC Managed Care – PPO | Admitting: Family Medicine

## 2020-06-28 ENCOUNTER — Other Ambulatory Visit: Payer: Self-pay

## 2020-06-28 VITALS — BP 132/82 | HR 105 | Temp 99.6°F | Wt 255.6 lb

## 2020-06-28 DIAGNOSIS — H6981 Other specified disorders of Eustachian tube, right ear: Secondary | ICD-10-CM | POA: Diagnosis not present

## 2020-06-28 DIAGNOSIS — R42 Dizziness and giddiness: Secondary | ICD-10-CM

## 2020-06-28 MED ORDER — MECLIZINE HCL 12.5 MG PO TABS: 12.5000 mg | ORAL_TABLET | Freq: Three times a day (TID) | ORAL | 0 refills | Status: DC | PRN

## 2020-06-28 NOTE — Patient Instructions (Signed)
For the next several days use Afrin nasal spray twice per day

## 2020-06-28 NOTE — Progress Notes (Signed)
   Subjective:    Patient ID: Zachary Schultz, male    DOB: 1981-10-06, 39 y.o.   MRN: [id number]  HPI He states that on last Saturday he developed some fullness in the right ear as well as a clicking sensation behind the eardrum and some dizziness but no earache, sore throat, fever, chills.  Also as a side note he does state that the ADD med seems to be helping him.   Review of Systems     Objective:   Physical Exam Alert and in no distress. Tympanic membranes and canals are normal. Pharyngeal area is normal. Neck is supple without adenopathy or thyromegaly. Cardiac exam shows a regular sinus rhythm without murmurs or gallops. Lungs are clear to auscultation.        Assessment & Plan:  Vertigo - Plan: meclizine (ANTIVERT) 12.5 MG tablet  Dysfunction of right eustachian tube I explained that the 2 issues can be interrelated and using Antivert judiciously can help.  Cautioned against sedation from this medication.  Also recommend Afrin nasal spray twice per day for the next 3 days to see if this will help with the eustachian tube dysfunction.  He will keep [place] informed.

## 2020-07-03 ENCOUNTER — Other Ambulatory Visit: Payer: Self-pay | Admitting: Cardiology

## 2020-07-03 ENCOUNTER — Other Ambulatory Visit: Payer: BLUE CROSS/BLUE SHIELD

## 2020-07-03 DIAGNOSIS — Z20822 Contact with and (suspected) exposure to covid-19: Secondary | ICD-10-CM

## 2020-07-04 ENCOUNTER — Telehealth: Payer: Self-pay | Admitting: Family Medicine

## 2020-07-04 LAB — NOVEL CORONAVIRUS, NAA: SARS-CoV-2, NAA: DETECTED — AB

## 2020-07-04 LAB — SARS-COV-2, NAA 2 DAY TAT

## 2020-07-04 NOTE — Telephone Encounter (Signed)
Let him know that I received the message. Make sure he is feeling ok and self isolating. We can do a virtual visit if needed.

## 2020-07-04 NOTE — Telephone Encounter (Signed)
Left message for pt to call me back 

## 2020-07-04 NOTE — Telephone Encounter (Signed)
Pt wanted to call and let you know that he tested positive for Covid He was tested yesterday, started having symptoms 5 days ago He will call if he needs anything

## 2020-07-06 ENCOUNTER — Emergency Department (HOSPITAL_COMMUNITY)
Admission: EM | Admit: 2020-07-06 | Discharge: 2020-07-06 | Disposition: A | Discharge status: Home / Self Care | Payer: BC Managed Care – PPO | Attending: Emergency Medicine | Admitting: Emergency Medicine

## 2020-07-06 ENCOUNTER — Other Ambulatory Visit: Payer: Self-pay

## 2020-07-06 ENCOUNTER — Encounter (HOSPITAL_COMMUNITY): Payer: Self-pay

## 2020-07-06 ENCOUNTER — Emergency Department (HOSPITAL_COMMUNITY): Payer: BC Managed Care – PPO

## 2020-07-06 ENCOUNTER — Telehealth: Payer: Self-pay | Admitting: Family Medicine

## 2020-07-06 DIAGNOSIS — R0602 Shortness of breath: Secondary | ICD-10-CM | POA: Diagnosis present

## 2020-07-06 DIAGNOSIS — G4733 Obstructive sleep apnea (adult) (pediatric): Secondary | ICD-10-CM | POA: Insufficient documentation

## 2020-07-06 DIAGNOSIS — I10 Essential (primary) hypertension: Secondary | ICD-10-CM | POA: Diagnosis not present

## 2020-07-06 DIAGNOSIS — J1282 Pneumonia due to coronavirus disease 2019: Secondary | ICD-10-CM | POA: Diagnosis not present

## 2020-07-06 DIAGNOSIS — Z6839 Body mass index (BMI) 39.0-39.9, adult: Secondary | ICD-10-CM | POA: Diagnosis not present

## 2020-07-06 DIAGNOSIS — Z87891 Personal history of nicotine dependence: Secondary | ICD-10-CM | POA: Diagnosis not present

## 2020-07-06 DIAGNOSIS — E669 Obesity, unspecified: Secondary | ICD-10-CM | POA: Diagnosis not present

## 2020-07-06 DIAGNOSIS — U071 COVID-19: Secondary | ICD-10-CM

## 2020-07-06 HISTORY — DX: Essential (primary) hypertension: I10

## 2020-07-06 MED ORDER — BENZONATATE 100 MG PO CAPS
100.0000 mg | ORAL_CAPSULE | Freq: Three times a day (TID) | ORAL | 0 refills | Status: DC
Start: 2020-07-06 — End: 2020-08-01

## 2020-07-06 NOTE — Telephone Encounter (Signed)
Pt was advised to go to hospital due to his symptoms. His pulse was 118 when he was on the phone with me and he was cough. I advised him he didn't need to wait as he didn't need to have more issus.

## 2020-07-06 NOTE — ED Triage Notes (Signed)
Patient states that he had a covid + test 2 days ago. Patient states he is feeling better, but he was sent by his PCP to r/o pneumonia. Patient c/o not being able to take a "deep breath." Sats-95%

## 2020-07-06 NOTE — Telephone Encounter (Signed)
Pt is having 103/104 fever. Oxygen is staying around 88-91. Cough. Having SOB and sweating. Pt feels horrible. Not vaccinated   Per vickie- must go to ER to be evaluated.

## 2020-07-06 NOTE — Discharge Instructions (Addendum)
Your chest x-ray demonstrated pneumonia consistent with Covid pneumonia.  Take Tessalon Perles as needed for cough.  Call and follow-up closely with post Covid clinic at [facility] for further management of your condition.  You may benefit from antiviral medication or steroid if deemed appropriate.  Return promptly to the ER if your symptoms worsen or if you have any other concern.  Please encourage your friends and families to get vaccinated for COVID-19.

## 2020-07-06 NOTE — ED Provider Notes (Signed)
Zachary Schultz Provider Note   CSN: [id number] Arrival date & time: 07/06/20  1325     History Chief Complaint  Patient presents with  . Shortness of Breath  . Covid positive    Zachary Schultz is a 39 y.o. male.  The history is provided by the patient and medical records. No language interpreter was used.  Shortness of Breath    39 year old male with hx of HTN, OSA, HLD recently test positive for COVID-19 presenting per recommendations of his PCP for chest x-ray.  Patient report for the past 5 days he has had subjective fever, chills, body aches, persistent cough, headache, dizziness, and decrease in taste and smell.  2 days ago he felt very bad nearly passing out and having shortness of breath.  Symptom has improved.  He was seen by his PCP several days ago and tested positive for COVID-19.  He was notified the positive test yesterday.  His primary care provider also request patient to come to the ER for chest x-ray to see if he has Covid pneumonia.  Patient admits that he went to [facility] last week.  He reported no one else at home with similar symptoms.  He has not been vaccinated and his family member has not been vaccinated.  States that his appetite has been normal.  He denies any GI upset.  He does have a pulse oximetry at home and noticed several reading at 87% at nighttime several days ago but it has improved.     Past Medical History:  Diagnosis Date  . Anxiety 11/09/2018  . Elevated blood pressure reading without diagnosis of hypertension 04/22/2018  . Elevated triglycerides with high cholesterol    2014  . Hyperlipidemia   . Hypertension   . OSA (obstructive sleep apnea) 02/27/2019  . Stress 11/09/2018    Patient Active Problem List   Diagnosis Date Noted  . Mood changes 02/14/2020  . OSA (obstructive sleep apnea) 02/27/2019  . Alcohol dependence, binge pattern (HCC) 02/23/2019  . Hypertension 11/09/2018  . Sleep disturbances  11/09/2018  . Stress 11/09/2018  . Anxiety 11/09/2018  . Personal history of noncompliance with medical treatment, presenting hazards to health 11/09/2018  . Prediabetes 05/05/2018  . Alcohol consumption binge drinking 05/05/2018  . Witnessed episode of apnea 05/05/2018  . Allergic rhinitis 04/22/2018  . At risk for sleep apnea 04/22/2018  . Gastroesophageal reflux disease 04/22/2018  . Family history of diabetes mellitus (DM) 04/22/2018  . Obesity (BMI 30-39.9) 04/22/2018  . Sleep disorder 09/17/2016  . Mixed hyperlipidemia 09/17/2016  . Elevated ALT measurement 09/17/2016  . Elevated triglycerides with high cholesterol     History reviewed. No pertinent surgical history.     Family History  Problem Relation Age of Onset  . Diabetes Father     Social History   Tobacco Use  . Smoking status: Former Smoker    Types: Cigarettes  . Smokeless tobacco: Never Used  . Tobacco comment: not in two years  Vaping Use  . Vaping Use: Never used  Substance Use Topics  . Alcohol use: Yes    Alcohol/week: 24.0 standard drinks    Types: 24 Cans of beer per week    Comment: once a week  . Drug use: Yes    Types: Marijuana    Comment: not for 2 years    Home Medications Prior to Admission medications   Medication Sig Start Date End Date Taking? Authorizing Provider  amphetamine-dextroamphetamine (ADDERALL XR)  20 MG 24 hr capsule Take 20 mg by mouth every morning. 06/22/20   [provider]  atorvastatin (LIPITOR) 20 MG tablet Take 1 tablet by mouth once daily 03/19/20   Henson, Vickie L, NP-C  citalopram (CELEXA) 20 MG tablet Take 1 tablet (20 mg total) by mouth daily. Take 1/2 tablet (10 mg) for first week. 02/23/19   Henson, Vickie L, NP-C  lisinopril-hydrochlorothiazide (ZESTORETIC) 20-12.5 MG tablet Take 1 tablet by mouth once daily 02/13/20   Henson, Vickie L, NP-C  meclizine (ANTIVERT) 12.5 MG tablet Take 1 tablet (12.5 mg total) by mouth 3 (three) times daily as needed  for dizziness. 06/28/20   Ronnald Nian, MD    Allergies    Patient has no known allergies.  Review of Systems   Review of Systems  Respiratory: Positive for shortness of breath.   All other systems reviewed and are negative.   Physical Exam Updated Vital Signs BP 114/74   Pulse (!) 106   Temp 99.9 F (37.7 C) (Oral)   Resp 20   Ht 5\' 7"  (1.702 m)   Wt 115.7 kg   SpO2 95%   BMI 39.94 kg/m   Physical Exam Vitals and nursing note reviewed.  Constitutional:      General: He is not in acute distress.    Appearance: He is well-developed. He is obese.  HENT:     Head: Atraumatic.  Eyes:     Conjunctiva/sclera: Conjunctivae normal.  Cardiovascular:     Rate and Rhythm: Normal rate and regular rhythm.  Pulmonary:     Effort: Pulmonary effort is normal.     Breath sounds: Normal breath sounds. No decreased breath sounds, wheezing, rhonchi or rales.  Chest:     Chest wall: No tenderness.  Abdominal:     Palpations: Abdomen is soft.     Tenderness: There is no abdominal tenderness.  Musculoskeletal:     Cervical back: Neck supple.     Right lower leg: No edema.     Left lower leg: No edema.  Skin:    Findings: No rash.  Neurological:     Mental Status: He is alert and oriented to person, place, and time.  Psychiatric:        Mood and Affect: Mood normal.     ED Results / Procedures / Treatments   Labs (all labs ordered are listed, but only abnormal results are displayed) Labs Reviewed - No data to display  EKG None  Radiology DG Chest 2 View  Result Date: 07/06/2020 CLINICAL DATA:  Shortness of breath and COVID-19 positivity EXAM: CHEST - 2 VIEW COMPARISON:  None. FINDINGS: Cardiac shadow is within normal limits. Patchy opacities are noted in both lungs consistent with the given clinical history. No sizable effusion is noted. No bony abnormality is noted. IMPRESSION: Patchy opacities bilaterally consistent with the given history. Electronically Signed   By:  07/08/2020 M.D.   On: 07/06/2020 14:38    Procedures Procedures (including critical care time)  Medications Ordered in ED Medications - No data to display  ED Course  I have reviewed the triage vital signs and the nursing notes.  Pertinent labs & imaging results that were available during my care of the patient were reviewed by me and considered in my medical decision making (see chart for details).    MDM Rules/Calculators/A&P  BP 114/74   Pulse (!) 106   Temp 99.9 F (37.7 C) (Oral)   Resp 20   Ht 5\' 7"  (1.702 m)   Wt 115.7 kg   SpO2 95%   BMI 39.94 kg/m   Final Clinical Impression(s) / ED Diagnoses Final diagnoses:  Pneumonia due to COVID-19 virus    Rx / DC Orders ED Discharge Orders         Ordered    benzonatate (TESSALON) 100 MG capsule  Every 8 hours     Discontinue  Reprint     07/06/20 1519         3:14 PM This is an unvaccinated patient here with Covid symptoms.  Test positive for COVID-19 at his PCPs office several days prior but here per his PCP request for chest x-ray due to concerns for Covid pneumonia.  He has history of pneumonia in the past.  Chest x-ray today demonstrate patchy opacities bilaterally consistent with Covid pneumonia.  Patient however is not hypoxic, he reported feeling much better than several days prior.  At this time he does not meet criteria to be admitted as he is not hypoxic.  However patient would be a good candidate for monoclonal antibody infusion and post Covid clinic at [facility].  Return precaution discussed.  Abad Manard was evaluated in Emergency Department on 07/06/2020 for the symptoms described in the history of present illness. He was evaluated in the context of the global COVID-19 pandemic, which necessitated consideration that the patient might be at risk for infection with the SARS-CoV-2 virus that causes COVID-19. Institutional protocols and algorithms that pertain to the evaluation of patients at  risk for COVID-19 are in a state of rapid change based on information released by regulatory bodies including the CDC and federal and state organizations. These policies and algorithms were followed during the patient's care in the ED.    07/08/2020, PA-C 07/06/20 1522    07/08/20, MD 07/25/20 1436

## 2020-07-06 NOTE — Telephone Encounter (Signed)
I was advised that his oxygen level was less than 90%, fever up to 103, and his heart rate elevated. He needs to go to the emergency department to be evaluated.

## 2020-07-06 NOTE — Telephone Encounter (Signed)
Pts wife called and was wondering if there is anything Zachary Schultz can do for the symptoms he is having with Covid. He is having coughing, SOB,fever,Diarrhea and fatiuge. She wants to know if there is anything he can take?

## 2020-08-01 ENCOUNTER — Other Ambulatory Visit: Payer: Self-pay

## 2020-08-01 ENCOUNTER — Encounter: Payer: Self-pay | Admitting: Family Medicine

## 2020-08-01 ENCOUNTER — Ambulatory Visit: Payer: BC Managed Care – PPO | Admitting: Family Medicine

## 2020-08-01 VITALS — BP 120/84 | HR 109 | Resp 16 | Wt 249.2 lb

## 2020-08-01 DIAGNOSIS — F1010 Alcohol abuse, uncomplicated: Secondary | ICD-10-CM

## 2020-08-01 DIAGNOSIS — U071 COVID-19: Secondary | ICD-10-CM | POA: Diagnosis not present

## 2020-08-01 DIAGNOSIS — I10 Essential (primary) hypertension: Secondary | ICD-10-CM

## 2020-08-01 DIAGNOSIS — R071 Chest pain on breathing: Secondary | ICD-10-CM

## 2020-08-01 DIAGNOSIS — G4733 Obstructive sleep apnea (adult) (pediatric): Secondary | ICD-10-CM

## 2020-08-01 DIAGNOSIS — R0602 Shortness of breath: Secondary | ICD-10-CM

## 2020-08-01 DIAGNOSIS — E782 Mixed hyperlipidemia: Secondary | ICD-10-CM

## 2020-08-01 DIAGNOSIS — K219 Gastro-esophageal reflux disease without esophagitis: Secondary | ICD-10-CM

## 2020-08-01 DIAGNOSIS — J1282 Pneumonia due to coronavirus disease 2019: Secondary | ICD-10-CM

## 2020-08-01 DIAGNOSIS — R5383 Other fatigue: Secondary | ICD-10-CM

## 2020-08-01 DIAGNOSIS — Z8616 Personal history of COVID-19: Secondary | ICD-10-CM

## 2020-08-01 DIAGNOSIS — R7303 Prediabetes: Secondary | ICD-10-CM

## 2020-08-01 MED ORDER — ALBUTEROL SULFATE HFA 108 (90 BASE) MCG/ACT IN AERS: 2.0000 | INHALATION_SPRAY | Freq: Four times a day (QID) | RESPIRATORY_TRACT | 0 refills | Status: DC | PRN

## 2020-08-01 NOTE — Patient Instructions (Addendum)
Call and get a mask that fits your face better so that you can start using your CPAP again for sleep apnea.  This will help your blood pressure and your breathing at night as well as help you get better sleep.   Check your blood pressure at home and keep a record of the readings. 130/80 or less is our goal.   Take Mucinex twice daily for the next 5 days.  Drink plenty of water. NO ALCOHOL.  Schedule AA meetings.   Use the albuterol inhaler 2-3 times per day as prescribed to help open up your lungs.  Be aware your cough may become more productive.  Make sure you are resting and taking good care of yourself.  Eat a healthy diet.  Start taking your acid reflux medication daily for the next 2 to 4 weeks.  Avoid foods that trigger your reflux.  I am referring you to the post Covid clinic so that they can keep a close eye on your progression.

## 2020-08-01 NOTE — Progress Notes (Signed)
Subjective:    Patient ID: Zachary Schultz, male    DOB: [birth year], 39 y.o.   MRN: [id number]  HPI Chief Complaint  Patient presents with  . bp elevated    bp elevated 137/90. started drinking friday and finished sunday morning. stopped cold [place] and woke up with chest pain and started panicking,  had covid a month ago and pneumonia so lungs hasn't been back to normal   Here with complaints of elevated BP and recent binge drinking. States he has been recovering from pneumonia related to Covid. He is still having shortness of breath and coughing.  States he also has significant fatigue.  He is approximately 1 month from onset of symptoms.  He has not been taking anything at home for his symptoms.  Reports having a long weekend of heavy alcohol use and he woke up Sunday morning feeling anxious and with mid sternal chest pain and indigestion. States the chest pain resolved. Feels much better today except for difficulty taking a deep breath and coughing which has been the case since his Covid infection.   His diet has also been poor with spicy foods.   States he has not been able to use his CPAP due to his mask not fitting properly.  He will need to contact the company who is taking care of this.   States he does not take his ADD medication when his BP is elevated or when he drinks alcohol.   States he plans to go to [employer] and stop alcohol again.   Denies fever, chills, dizziness, palpitations, shortness of breath, abdominal pain, N/V/D, LE edema or calf pain.   Reviewed allergies, medications, past medical, surgical, family, and social history.   Review of Systems Pertinent positives and negatives in the history of present illness.     Objective:   Physical Exam BP 120/84   Pulse (!) 109   Resp 16   Wt 249 lb 3.2 oz (113 kg)   SpO2 96%   BMI 39.03 kg/m   Alert and in no distress.Cardiac exam shows a regular sinus rhythm without murmurs or gallops. Lungs are clear to  auscultation. Extremities without edema, skin is warm and dry.       Assessment & Plan:  Uncontrolled hypertension - Plan: CBC with Differential/Platelet, Comprehensive metabolic panel -Blood pressure initially elevated after his arrival today.  Recheck prior to discharge showed significant improvement.  Recommend he take his blood pressure medication daily and limit his sodium intake as well as alcohol intake.  Also discussed how untreated sleep apnea plays a role in elevated blood pressure  Chest pain on breathing -This has been ongoing since Covid infection approximately 1 month ago.  I will prescribe albuterol for him to try to open up his airways and recommend he hydrate and take Mucinex twice daily for the next week.  Referral to the post Covid clinic  Shortness of breath - Plan: albuterol (VENTOLIN HFA) 108 (90 Base) MCG/ACT inhaler -No acute distress.  No sign of infection or blood clot.  Try albuterol inhaler, Mucinex and referred to the post Covid clinic  Pneumonia due to COVID-19 virus - Plan: albuterol (VENTOLIN HFA) 108 (90 Base) MCG/ACT inhaler -Treat symptoms and refer to the post Covid clinic  History of COVID-19  Prediabetes - Plan: Hemoglobin A1c -Check labs and follow-up  OSA (obstructive sleep apnea) -Recommend he call and get a new facemask so that he can start wearing his CPAP  Fatigue, unspecified type -Suspect this  is related to recent Covid infection.  Check labs and follow-up  Gastroesophageal reflux disease, unspecified whether esophagitis present -Discussed starting on PPI daily and avoiding triggers  Alcohol consumption binge drinking -Recommend stopping alcohol and attending AA meetings  Mixed hyperlipidemia - Plan: Lipid panel -Reports he has been taking statin.  Check lipid panel and follow-up

## 2020-08-02 ENCOUNTER — Other Ambulatory Visit: Payer: Self-pay | Admitting: Internal Medicine

## 2020-08-02 DIAGNOSIS — E782 Mixed hyperlipidemia: Secondary | ICD-10-CM

## 2020-08-02 DIAGNOSIS — R748 Abnormal levels of other serum enzymes: Secondary | ICD-10-CM

## 2020-08-02 LAB — COMPREHENSIVE METABOLIC PANEL
ALT: 79 IU/L — ABNORMAL HIGH (ref 0–44)
AST: 56 IU/L — ABNORMAL HIGH (ref 0–40)
Albumin/Globulin Ratio: 1.7 (ref 1.2–2.2)
Albumin: 5 g/dL (ref 4.0–5.0)
Alkaline Phosphatase: 100 IU/L (ref 48–121)
BUN/Creatinine Ratio: 18 (ref 9–20)
BUN: 22 mg/dL — ABNORMAL HIGH (ref 6–20)
Bilirubin Total: 0.5 mg/dL (ref 0.0–1.2)
CO2: 23 mmol/L (ref 20–29)
Calcium: 10 mg/dL (ref 8.7–10.2)
Chloride: 101 mmol/L (ref 96–106)
Creatinine, Ser: 1.19 mg/dL (ref 0.76–1.27)
GFR calc Af Amer: 89 mL/min/{1.73_m2} (ref >59)
GFR calc non Af Amer: 77 mL/min/{1.73_m2} (ref >59)
Globulin, Total: 3 g/dL (ref 1.5–4.5)
Glucose: 106 mg/dL — ABNORMAL HIGH (ref 65–99)
Potassium: 4.6 mmol/L (ref 3.5–5.2)
Sodium: 140 mmol/L (ref 134–144)
Total Protein: 8 g/dL (ref 6.0–8.5)

## 2020-08-02 LAB — CBC WITH DIFFERENTIAL/PLATELET
Basophils Absolute: 0.1 10*3/uL (ref 0.0–0.2)
Basos: 1 %
EOS (ABSOLUTE): 0.2 10*3/uL (ref 0.0–0.4)
Eos: 2 %
Hematocrit: 45 % (ref 37.5–51.0)
Hemoglobin: 15.8 g/dL (ref 13.0–17.7)
Immature Grans (Abs): 0 10*3/uL (ref 0.0–0.1)
Immature Granulocytes: 0 %
Lymphocytes Absolute: 2 10*3/uL (ref 0.7–3.1)
Lymphs: 25 %
MCH: 31 pg (ref 26.6–33.0)
MCHC: 35.1 g/dL (ref 31.5–35.7)
MCV: 88 fL (ref 79–97)
Monocytes Absolute: 0.8 10*3/uL (ref 0.1–0.9)
Monocytes: 10 %
Neutrophils Absolute: 5.1 10*3/uL (ref 1.4–7.0)
Neutrophils: 62 %
Platelets: 205 10*3/uL (ref 150–450)
RBC: 5.09 x10E6/uL (ref 4.14–5.80)
RDW: 13 % (ref 11.6–15.4)
WBC: 8.1 10*3/uL (ref 3.4–10.8)

## 2020-08-02 LAB — LIPID PANEL
Chol/HDL Ratio: 6.1 ratio — ABNORMAL HIGH (ref 0.0–5.0)
Cholesterol, Total: 184 mg/dL (ref 100–199)
HDL: 30 mg/dL — ABNORMAL LOW (ref >39)
LDL Chol Calc (NIH): 61 mg/dL (ref 0–99)
Triglycerides: 616 mg/dL (ref 0–149)
VLDL Cholesterol Cal: 93 mg/dL — ABNORMAL HIGH (ref 5–40)

## 2020-08-02 LAB — HEMOGLOBIN A1C
Est. average glucose Bld gHb Est-mCnc: 123 mg/dL
Hgb A1c MFr Bld: 5.9 % — ABNORMAL HIGH (ref 4.8–5.6)

## 2020-08-02 NOTE — Progress Notes (Signed)
We need to recheck his lipid panel due to his triglycerides being severely elevated. I understand he was not fasting yesterday. Please schedule him for a lab visit to recheck lipids FASTING in the next week or two. Also, I recommend a liver US to look at his liver (RUQ [place]) due to elevated liver enzymes.  Ask Byrd Hesselbach to add an acute hepatitis panel please.

## 2020-08-05 LAB — SPECIMEN STATUS REPORT

## 2020-08-05 LAB — HEPATITIS PANEL, ACUTE
Hep A IgM: NEGATIVE
Hep B C IgM: NEGATIVE
Hep C Virus Ab: <0.1 s/co ratio (ref 0.0–0.9)
Hepatitis B Surface Ag: NEGATIVE

## 2020-08-08 ENCOUNTER — Other Ambulatory Visit: Payer: BC Managed Care – PPO

## 2020-08-08 ENCOUNTER — Ambulatory Visit
Admission: RE | Admit: 2020-08-08 | Discharge: 2020-08-08 | Disposition: A | Discharge status: Home / Self Care | Payer: BC Managed Care – PPO | Source: Ambulatory Visit | Attending: Family Medicine | Admitting: Family Medicine

## 2020-08-08 ENCOUNTER — Other Ambulatory Visit: Payer: Self-pay

## 2020-08-08 DIAGNOSIS — E782 Mixed hyperlipidemia: Secondary | ICD-10-CM

## 2020-08-08 DIAGNOSIS — R748 Abnormal levels of other serum enzymes: Secondary | ICD-10-CM

## 2020-08-09 LAB — LIPID PANEL
Chol/HDL Ratio: 4.5 ratio (ref 0.0–5.0)
Cholesterol, Total: 162 mg/dL (ref 100–199)
HDL: 36 mg/dL — ABNORMAL LOW (ref >39)
LDL Chol Calc (NIH): 97 mg/dL (ref 0–99)
Triglycerides: 165 mg/dL — ABNORMAL HIGH (ref 0–149)
VLDL Cholesterol Cal: 29 mg/dL (ref 5–40)

## 2020-08-14 ENCOUNTER — Ambulatory Visit: Payer: BC Managed Care – PPO

## 2020-08-15 ENCOUNTER — Telehealth: Payer: Self-pay

## 2020-08-15 NOTE — Telephone Encounter (Signed)
Pt. Called for last lab results and [place] results, I gave him the results of both his lipid panel and [place] and the recommendations.

## 2020-08-21 ENCOUNTER — Other Ambulatory Visit: Payer: Self-pay | Admitting: Family Medicine

## 2020-09-17 ENCOUNTER — Ambulatory Visit: Payer: BC Managed Care – PPO | Admitting: Family Medicine

## 2020-10-10 ENCOUNTER — Ambulatory Visit: Payer: BC Managed Care – PPO | Admitting: Family Medicine

## 2020-10-15 ENCOUNTER — Encounter: Payer: Self-pay | Admitting: Family Medicine

## 2020-11-17 ENCOUNTER — Emergency Department (HOSPITAL_COMMUNITY)
Admission: EM | Admit: 2020-11-17 | Discharge: 2020-11-17 | Disposition: A | Discharge status: Home / Self Care | Payer: BC Managed Care – PPO | Attending: Emergency Medicine | Admitting: Emergency Medicine

## 2020-11-17 ENCOUNTER — Emergency Department (HOSPITAL_COMMUNITY): Payer: BC Managed Care – PPO

## 2020-11-17 ENCOUNTER — Other Ambulatory Visit: Payer: Self-pay

## 2020-11-17 ENCOUNTER — Encounter (HOSPITAL_COMMUNITY): Payer: Self-pay

## 2020-11-17 DIAGNOSIS — Z79899 Other long term (current) drug therapy: Secondary | ICD-10-CM | POA: Insufficient documentation

## 2020-11-17 DIAGNOSIS — I10 Essential (primary) hypertension: Secondary | ICD-10-CM | POA: Insufficient documentation

## 2020-11-17 DIAGNOSIS — R002 Palpitations: Secondary | ICD-10-CM | POA: Insufficient documentation

## 2020-11-17 DIAGNOSIS — R079 Chest pain, unspecified: Secondary | ICD-10-CM | POA: Diagnosis not present

## 2020-11-17 DIAGNOSIS — Z87891 Personal history of nicotine dependence: Secondary | ICD-10-CM | POA: Insufficient documentation

## 2020-11-17 LAB — HEPATIC FUNCTION PANEL
ALT: 52 U/L — ABNORMAL HIGH (ref 0–44)
AST: 55 U/L — ABNORMAL HIGH (ref 15–41)
Albumin: 4.6 g/dL (ref 3.5–5.0)
Alkaline Phosphatase: 82 U/L (ref 38–126)
Bilirubin, Direct: 0.2 mg/dL (ref 0.0–0.2)
Indirect Bilirubin: 0.6 mg/dL (ref 0.3–0.9)
Total Bilirubin: 0.8 mg/dL (ref 0.3–1.2)
Total Protein: 7.7 g/dL (ref 6.5–8.1)

## 2020-11-17 LAB — CBC
HCT: 48.1 % (ref 39.0–52.0)
Hemoglobin: 16.4 g/dL (ref 13.0–17.0)
MCH: 31.1 pg (ref 26.0–34.0)
MCHC: 34.1 g/dL (ref 30.0–36.0)
MCV: 91.3 fL (ref 80.0–100.0)
Platelets: 224 10*3/uL (ref 150–400)
RBC: 5.27 MIL/uL (ref 4.22–5.81)
RDW: 11.9 % (ref 11.5–15.5)
WBC: 9.3 10*3/uL (ref 4.0–10.5)
nRBC: 0 % (ref 0.0–0.2)

## 2020-11-17 LAB — BASIC METABOLIC PANEL
Anion gap: 13 (ref 5–15)
BUN: 14 mg/dL (ref 6–20)
CO2: 26 mmol/L (ref 22–32)
Calcium: 9.5 mg/dL (ref 8.9–10.3)
Chloride: 98 mmol/L (ref 98–111)
Creatinine, Ser: 0.92 mg/dL (ref 0.61–1.24)
GFR, Estimated: >60 mL/min (ref >60)
Glucose, Bld: 111 mg/dL — ABNORMAL HIGH (ref 70–99)
Potassium: 4.3 mmol/L (ref 3.5–5.1)
Sodium: 137 mmol/L (ref 135–145)

## 2020-11-17 LAB — TROPONIN I (HIGH SENSITIVITY)
Troponin I (High Sensitivity): 4 ng/L (ref <18)
Troponin I (High Sensitivity): 3 ng/L (ref <18)

## 2020-11-17 LAB — LIPASE, BLOOD: Lipase: 26 U/L (ref 11–51)

## 2020-11-17 MED ORDER — ALUM & MAG HYDROXIDE-SIMETH 200-200-20 MG/5ML PO SUSP
30.0000 mL | Freq: Once | ORAL | Status: AC
  Administered 2020-11-17: 30 mL via ORAL
  Filled 2020-11-17: qty 30

## 2020-11-17 MED ORDER — HYDROXYZINE HCL 25 MG PO TABS
25.0000 mg | ORAL_TABLET | Freq: Once | ORAL | Status: AC
  Administered 2020-11-17: 25 mg via ORAL
  Filled 2020-11-17: qty 1

## 2020-11-17 MED ORDER — LACTATED RINGERS IV BOLUS
1000.0000 mL | Freq: Once | INTRAVENOUS | Status: AC
  Administered 2020-11-17: 1000 mL via INTRAVENOUS

## 2020-11-17 NOTE — Discharge Instructions (Addendum)
It was a pleasure taking care of you today! We do not see signs of pneumonia, heart failure, heart attack, collapsed lung, pancreatitis on your imaging and lab work.  Please follow up with your primary care doctor.  We applaud you on your desire to focus on lifestyle changes to improve your health!  Merry Christmas!

## 2020-11-17 NOTE — ED Notes (Signed)
X RAY at bedside 

## 2020-11-17 NOTE — ED Triage Notes (Signed)
Pt presents with c/o chest pain with associated dizziness and shortness of breath. Pt reports the symptoms started approx 2 hours ago.

## 2020-11-17 NOTE — ED Provider Notes (Signed)
[facility]-EMERGENCY DEPT Provider Note   CSN: [id number] Arrival date & time: 11/17/20  1504     History Chief Complaint  Patient presents with  . Chest Pain    Zachary Schultz is a 39 y.o. male with past medical history of hypertension, hyperlipidemia, OSA, stress, alcohol use, anxiety, who presents today for evaluation of chest pain. He reports that last night he drank about 11 shots of hard liquor and got drunk.  He did not drink much water last night.  Today he got up and was opening presents, he drank 2 large cups of coffee with "only a little water."  He states that he started to feel like his blood pressure was high, he checked it and it was in the 160s systolic.  He states that he then started to feel very anxious, can feel his heart pounding.  He denies any shortness of breath.  No leg swelling.  He denies any headache.  No history of DVT/PE.  He denies any drug use other than alcohol and caffeine.  He has not had anything similar before.  He denies any known sick contacts or fevers.  HPI     Past Medical History:  Diagnosis Date  . Anxiety 11/09/2018  . Elevated blood pressure reading without diagnosis of hypertension 04/22/2018  . Elevated triglycerides with high cholesterol    2014  . Hyperlipidemia   . Hypertension   . OSA (obstructive sleep apnea) 02/27/2019  . Stress 11/09/2018    Patient Active Problem List   Diagnosis Date Noted  . Mood changes 02/14/2020  . OSA (obstructive sleep apnea) 02/27/2019  . Alcohol dependence, binge pattern (HCC) 02/23/2019  . Hypertension 11/09/2018  . Sleep disturbances 11/09/2018  . Stress 11/09/2018  . Anxiety 11/09/2018  . Personal history of noncompliance with medical treatment, presenting hazards to health 11/09/2018  . Prediabetes 05/05/2018  . Alcohol consumption binge drinking 05/05/2018  . Witnessed episode of apnea 05/05/2018  . Allergic rhinitis 04/22/2018  . At risk for sleep apnea 04/22/2018  .  Gastroesophageal reflux disease 04/22/2018  . Family history of diabetes mellitus (DM) 04/22/2018  . Obesity (BMI 30-39.9) 04/22/2018  . Sleep disorder 09/17/2016  . Mixed hyperlipidemia 09/17/2016  . Elevated ALT measurement 09/17/2016  . Elevated triglycerides with high cholesterol     History reviewed. No pertinent surgical history.     Family History  Problem Relation Age of Onset  . Diabetes Father     Social History   Tobacco Use  . Smoking status: Former Smoker    Types: Cigarettes  . Smokeless tobacco: Never Used  . Tobacco comment: not in two years  Vaping Use  . Vaping Use: Never used  Substance Use Topics  . Alcohol use: Yes    Alcohol/week: 24.0 standard drinks    Types: 24 Cans of beer per week    Comment: once a week  . Drug use: Yes    Types: Marijuana    Comment: not for 2 years    Home Medications Prior to Admission medications   Medication Sig Start Date End Date Taking? Authorizing Provider  acetaminophen (TYLENOL) 500 MG tablet Take 500 mg by mouth every 6 (six) hours as needed for moderate pain.   Yes [provider]  albuterol (VENTOLIN HFA) 108 (90 Base) MCG/ACT inhaler Inhale 2 puffs into the lungs every 6 (six) hours as needed for wheezing or shortness of breath. 08/01/20  Yes Henson, Vickie L, NP-C  amphetamine-dextroamphetamine (ADDERALL XR) 20  MG 24 hr capsule Take 20 mg by mouth every morning. 06/22/20  Yes [provider]  atorvastatin (LIPITOR) 20 MG tablet Take 1 tablet by mouth once daily Patient taking differently: Take 20 mg by mouth daily. 08/22/20  Yes Henson, Vickie L, NP-C  lisinopril-hydrochlorothiazide (ZESTORETIC) 20-12.5 MG tablet Take 1 tablet by mouth once daily Patient taking differently: Take 1 tablet by mouth daily. 02/13/20  Yes Henson, Vickie L, NP-C  citalopram (CELEXA) 20 MG tablet Take 1 tablet (20 mg total) by mouth daily. Take 1/2 tablet (10 mg) for first week. Patient not taking: No sig reported  02/23/19   Hetty Blend L, NP-C  meclizine (ANTIVERT) 12.5 MG tablet Take 1 tablet (12.5 mg total) by mouth 3 (three) times daily as needed for dizziness. Patient not taking: No sig reported 06/28/20   Ronnald Nian, MD    Allergies    Patient has no known allergies.  Review of Systems   Review of Systems  Constitutional: Positive for fatigue. Negative for chills and fever.  HENT: Negative for congestion.   Eyes: Negative for visual disturbance.  Respiratory: Positive for chest tightness and shortness of breath. Negative for cough.   Cardiovascular: Positive for palpitations. Negative for chest pain and leg swelling.  Gastrointestinal: Negative for abdominal pain, diarrhea, nausea and vomiting.  Musculoskeletal: Negative for back pain and neck pain.  Skin: Negative for color change and rash.  Neurological: Negative for weakness and headaches.  Psychiatric/Behavioral: The patient is nervous/anxious.   All other systems reviewed and are negative.   Physical Exam Updated Vital Signs BP (!) 146/97   Pulse 84   Temp 98.9 F (37.2 C) (Oral)   Resp (!) 26   SpO2 98%   Physical Exam Vitals and nursing note reviewed.  Constitutional:      General: He is not in acute distress.    Appearance: He is obese. He is not diaphoretic.  HENT:     Head: Normocephalic and atraumatic.  Eyes:     General: No scleral icterus.       Right eye: No discharge.        Left eye: No discharge.     Conjunctiva/sclera: Conjunctivae normal.  Cardiovascular:     Rate and Rhythm: Regular rhythm. Tachycardia present.     Heart sounds: Normal heart sounds.  Pulmonary:     Effort: Pulmonary effort is normal. No tachypnea or respiratory distress.     Breath sounds: Normal breath sounds. No stridor. No decreased breath sounds.  Chest:     Chest wall: No tenderness.  Abdominal:     General: There is no distension.     Palpations: Abdomen is soft.     Tenderness: There is no abdominal tenderness.   Musculoskeletal:        General: No deformity.     Cervical back: Normal range of motion.     Right lower leg: No tenderness. No edema.     Left lower leg: No tenderness. No edema.  Skin:    General: Skin is warm and dry.  Neurological:     General: No focal deficit present.     Mental Status: He is alert.     Motor: No abnormal muscle tone.  Psychiatric:        Mood and Affect: Mood is anxious.        Behavior: Behavior normal.     ED Results / Procedures / Treatments   Labs (all labs ordered are listed, but only  abnormal results are displayed) Labs Reviewed  BASIC METABOLIC PANEL - Abnormal; Notable for the following components:      Result Value   Glucose, Bld 111 (*)    All other components within normal limits  HEPATIC FUNCTION PANEL - Abnormal; Notable for the following components:   AST 55 (*)    ALT 52 (*)    All other components within normal limits  CBC  LIPASE, BLOOD  TROPONIN I (HIGH SENSITIVITY)  TROPONIN I (HIGH SENSITIVITY)    EKG EKG Interpretation  Date/Time:  Saturday November 17 2020 15:20:48 EST Ventricular Rate:  109 PR Interval:    QRS Duration: 87 QT Interval:  341 QTC Calculation: 460 R Axis:   90 Text Interpretation: Sinus tachycardia Borderline right axis deviation 12 Lead; Mason-Likar No previous ECGs available Confirmed by Alvira Monday ([id number]) on 11/17/2020 10:11:10 PM   Radiology DG Chest Port 1 View  Result Date: 11/17/2020 CLINICAL DATA:  Chest pain.  Dizziness.  Shortness of breath. EXAM: PORTABLE CHEST 1 VIEW COMPARISON:  07/06/2020 FINDINGS: Midline trachea. Normal heart size for level of inspiration. No pleural effusion or pneumothorax. Low lung volumes with resultant pulmonary interstitial prominence. Clear lungs. IMPRESSION: Low lung volumes without acute disease. Electronically Signed   By: Jeronimo Greaves M.D.   On: 11/17/2020 16:41    Procedures Procedures (including critical care time)  Medications Ordered in  ED Medications  lactated ringers bolus 1,000 mL (0 mLs Intravenous Stopped 11/17/20 1828)  hydrOXYzine (ATARAX/VISTARIL) tablet 25 mg (25 mg Oral Given 11/17/20 1906)  alum & mag hydroxide-simeth (MAALOX/MYLANTA) 200-200-20 MG/5ML suspension 30 mL (30 mLs Oral Given 11/17/20 1906)    ED Course  I have reviewed the triage vital signs and the nursing notes.  Pertinent labs & imaging results that were available during my care of the patient were reviewed by me and considered in my medical decision making (see chart for details).  Clinical Course as of 11/18/20 0005  Sat Nov 17, 2020  1834 Reportedly per RN they are having to send his Hepatic funciton and lipase to [facility].  Patient states he feels better and wants to go home.   [EH]  1859 I had told patient that he had to have his hepatic function panel and lipase sent to [facility] for analysis due to them being abnormal.  After this patient had called out stating that he felt poorly again with elevated heart rate.  Asked RN to obtain repeat twelve-lead, if he is not driving we will give hydroxyzine. [EH]  2117 I called lab to follow-up on patient's hepatic function panel and lipase.  They report that his blood was too lipemic to be run here and has already been sent over to [facility]. [EH]    Clinical Course User Index [EH] Cristina Gong, PA-C   MDM Rules/Calculators/A&P                         Patient is to be discharged with recommendation to follow up with PCP in regards to today's hospital visit. Chest pain is not likely of cardiac or pulmonary etiology d/t presentation, VSS, no new murmur, RRR, breath sounds equal bilaterally, EKG without acute abnormalities, negative troponin x2 , and negative CXR. Pt has been advised to return to the ED if CP becomes exertional, associated with diaphoresis or nausea, radiates to left jaw/arm, worsens or becomes concerning in any way. Pt appears reliable for follow up and is agreeable to  discharge.    Patient is aware that he had high lipid levels as evidenced by his blood needing to be sent [facility], he states his understanding.   I suspect he has a component of dehydration combined with caffeine use contributing to his symptoms.   Case has been discussed with and seen by Dr. Dalene SeltzerSchlossman who agrees with the above plan to discharge.   Return precautions were discussed with patient who states their understanding.  At the time of discharge patient denied any unaddressed complaints or concerns.  Patient is agreeable for discharge home.  Note: Portions of this report may have been transcribed using voice recognition software. Every effort was made to ensure accuracy; however, inadvertent computerized transcription errors may be present  Final Clinical Impression(s) / ED Diagnoses Final diagnoses:  Chest pain, unspecified type  Palpitations    Rx / DC Orders ED Discharge Orders    None       Cristina GongHammond, Drevin Ortner W, PA-C 11/18/20 0007    Alvira MondaySchlossman, Erin, MD 11/19/20 [id number]    Alvira MondaySchlossman, Erin, MD 11/19/20 [id number]

## 2020-11-19 ENCOUNTER — Encounter: Payer: Self-pay | Admitting: Family Medicine

## 2020-11-19 ENCOUNTER — Ambulatory Visit (INDEPENDENT_AMBULATORY_CARE_PROVIDER_SITE_OTHER): Payer: BC Managed Care – PPO | Admitting: Family Medicine

## 2020-11-19 VITALS — BP 134/90 | HR 112 | Temp 98.5°F | Wt 245.0 lb

## 2020-11-19 DIAGNOSIS — I10 Essential (primary) hypertension: Secondary | ICD-10-CM | POA: Diagnosis not present

## 2020-11-19 DIAGNOSIS — G479 Sleep disorder, unspecified: Secondary | ICD-10-CM

## 2020-11-19 DIAGNOSIS — F1020 Alcohol dependence, uncomplicated: Secondary | ICD-10-CM | POA: Diagnosis not present

## 2020-11-19 DIAGNOSIS — F419 Anxiety disorder, unspecified: Secondary | ICD-10-CM | POA: Diagnosis not present

## 2020-11-19 DIAGNOSIS — R748 Abnormal levels of other serum enzymes: Secondary | ICD-10-CM

## 2020-11-19 DIAGNOSIS — G4733 Obstructive sleep apnea (adult) (pediatric): Secondary | ICD-10-CM

## 2020-11-19 NOTE — Progress Notes (Signed)
° °  Subjective:    Patient ID: Zachary Schultz, male    DOB: 1981-04-02, 39 y.o.   MRN: [id number]  HPI Chief Complaint  Patient presents with   other    BP went to the ER had anxiety, head ache, dizziness, not sleeping well    Is here to follow-up after ED visit on 11/17/2020.  He presented with chest pain, palpitations and anxiety after 2 to 3 days of heavy drinking.  He has a history of binge drinking. States his blood pressure was significantly elevated.  States he thought he was "going to die". States he is afraid he is "killing his liver". His liver enzymes were mildly elevated in the ED.   States since being discharged from the ED, he has stopped alcohol, started back on his blood pressure medication and has been getting help from his AA sponsor.  States he is ready to stop alcohol.  He still has intermittent headache, dizziness and trouble sleeping.  Reports his anxiety is very high. States he is using his CPAP but at times he takes it off at night when he gets frustrated with it.   He has not followed up with his psychiatrist. Chest pain has basically resolved. He had a cardiac work-up in the ED and no new symptoms.  Denies fever, chills, palpitations, shortness of breath, abdominal pain, N/V/D, urinary symptoms, LE edema.   Reviewed allergies, medications, past medical, surgical, family, and social history.    Review of Systems Pertinent positives and negatives in the history of present illness.     Objective:   Physical Exam BP 134/90    Pulse (!) 112    Temp 98.5 F (36.9 C)    Wt 245 lb (111.1 kg)    SpO2 97%    BMI 38.37 kg/m   Alert and in no distress.  Neck is supple without adenopathy or thyromegaly. Cardiac exam shows a regular sinus rhythm without murmurs or gallops. Lungs are clear to auscultation. Extremities without edema. Mood is slightly anxious.       Assessment & Plan:  Alcohol dependence, binge pattern (HCC)  Sleep disorder  Anxiety  Primary  hypertension  OSA (obstructive sleep apnea)  Elevated liver enzymes  Reviewed ED notes as well as labs. Negative cardiac work up. Liver function mildly elevated. He prefers to not have labs done today.  He will continue taking lisinopril-HCTZ and start back on atorvastatin. Continue to hold off on Adderall and follow up with psychiatrist for mood, sleep and alcoholism.  Continue AA.  Monitor BP over the next 4 weeks and follow up with readings. Avoid sodium.

## 2020-11-19 NOTE — Patient Instructions (Addendum)
Call Dr. Evelene Croon and schedule a visit ASAP.   Continue with AA.   Take your blood pressure medication once daily. Keep an eye on your BP at home.   Eat a low sodium diet.  Hydrate.  Use your CPAP.   Take the atorvastatin as well.   Return in 4 weeks with your readings.    DASH Eating Plan DASH stands for "Dietary Approaches to Stop Hypertension." The DASH eating plan is a healthy eating plan that has been shown to reduce high blood pressure (hypertension). It may also reduce your risk for type 2 diabetes, heart disease, and stroke. The DASH eating plan may also help with weight loss. What are tips for following this plan?  General guidelines  Avoid eating more than 2,300 mg (milligrams) of salt (sodium) a day. If you have hypertension, you may need to reduce your sodium intake to 1,500 mg a day.  Limit alcohol intake to no more than 1 drink a day for nonpregnant women and 2 drinks a day for men. One drink equals 12 oz of beer, 5 oz of wine, or 1 oz of hard liquor.  Work with your health care provider to maintain a healthy body weight or to lose weight. Ask what an ideal weight is for you.  Get at least 30 minutes of exercise that causes your heart to beat faster (aerobic exercise) most days of the week. Activities may include walking, swimming, or biking.  Work with your health care provider or diet and nutrition specialist (dietitian) to adjust your eating plan to your individual calorie needs. Reading food labels   Check food labels for the amount of sodium per serving. Choose foods with less than 5 percent of the Daily Value of sodium. Generally, foods with less than 300 mg of sodium per serving fit into this eating plan.  To find whole grains, look for the word "whole" as the first word in the ingredient list. Shopping  Buy products labeled as "low-sodium" or "no salt added."  Buy fresh foods. Avoid canned foods and premade or frozen meals. Cooking  Avoid adding salt when  cooking. Use salt-free seasonings or herbs instead of table salt or sea salt. Check with your health care provider or pharmacist before using salt substitutes.  Do not fry foods. Cook foods using healthy methods such as baking, boiling, grilling, and broiling instead.  Cook with heart-healthy oils, such as olive, canola, soybean, or sunflower oil. Meal planning  Eat a balanced diet that includes: ? 5 or more servings of fruits and vegetables each day. At each meal, try to fill half of your plate with fruits and vegetables. ? Up to 6-8 servings of whole grains each day. ? Less than 6 oz of lean meat, poultry, or fish each day. A 3-oz serving of meat is about the same size as a deck of cards. One egg equals 1 oz. ? 2 servings of low-fat dairy each day. ? A serving of nuts, seeds, or beans 5 times each week. ? Heart-healthy fats. Healthy fats called Omega-3 fatty acids are found in foods such as flaxseeds and coldwater fish, like sardines, salmon, and mackerel.  Limit how much you eat of the following: ? Canned or prepackaged foods. ? Food that is high in trans fat, such as fried foods. ? Food that is high in saturated fat, such as fatty meat. ? Sweets, desserts, sugary drinks, and other foods with added sugar. ? Full-fat dairy products.  Do not salt foods before  eating.  Try to eat at least 2 vegetarian meals each week.  Eat more home-cooked food and less restaurant, buffet, and fast food.  When eating at a restaurant, ask that your food be prepared with less salt or no salt, if possible. What foods are recommended? The items listed may not be a complete list. Talk with your dietitian about what dietary choices are best for you. Grains Whole-grain or whole-wheat bread. Whole-grain or whole-wheat pasta. Brown rice. Orpah Cobb. Bulgur. Whole-grain and low-sodium cereals. Pita bread. Low-fat, low-sodium crackers. Whole-wheat flour tortillas. Vegetables Fresh or frozen vegetables  (raw, steamed, roasted, or grilled). Low-sodium or reduced-sodium tomato and vegetable juice. Low-sodium or reduced-sodium tomato sauce and tomato paste. Low-sodium or reduced-sodium canned vegetables. Fruits All fresh, dried, or frozen fruit. Canned fruit in natural juice (without added sugar). Meat and other protein foods Skinless chicken or [place]. Ground chicken or [place]. Pork with fat trimmed off. Fish and seafood. Egg whites. Dried beans, peas, or lentils. Unsalted nuts, nut butters, and seeds. Unsalted canned beans. Lean cuts of beef with fat trimmed off. Low-sodium, lean deli meat. Dairy Low-fat (1%) or fat-free (skim) milk. Fat-free, low-fat, or reduced-fat cheeses. Nonfat, low-sodium ricotta or cottage cheese. Low-fat or nonfat yogurt. Low-fat, low-sodium cheese. Fats and oils Soft margarine without trans fats. Vegetable oil. Low-fat, reduced-fat, or light mayonnaise and salad dressings (reduced-sodium). Canola, safflower, olive, soybean, and sunflower oils. Avocado. Seasoning and other foods Herbs. Spices. Seasoning mixes without salt. Unsalted popcorn and pretzels. Fat-free sweets. What foods are not recommended? The items listed may not be a complete list. Talk with your dietitian about what dietary choices are best for you. Grains Baked goods made with fat, such as croissants, muffins, or some breads. Dry pasta or rice meal packs. Vegetables Creamed or fried vegetables. Vegetables in a cheese sauce. Regular canned vegetables (not low-sodium or reduced-sodium). Regular canned tomato sauce and paste (not low-sodium or reduced-sodium). Regular tomato and vegetable juice (not low-sodium or reduced-sodium). Rosita Fire. Olives. Fruits Canned fruit in a light or heavy syrup. Fried fruit. Fruit in cream or butter sauce. Meat and other protein foods Fatty cuts of meat. Ribs. Fried meat. Tomasa Blase. Sausage. Bologna and other processed lunch meats. Salami. Fatback. Hotdogs. Bratwurst. Salted nuts  and seeds. Canned beans with added salt. Canned or smoked fish. Whole eggs or egg yolks. Chicken or [place] with skin. Dairy Whole or 2% milk, cream, and half-and-half. Whole or full-fat cream cheese. Whole-fat or sweetened yogurt. Full-fat cheese. Nondairy creamers. Whipped toppings. Processed cheese and cheese spreads. Fats and oils Butter. Stick margarine. Lard. Shortening. Ghee. Bacon fat. Tropical oils, such as coconut, palm kernel, or palm oil. Seasoning and other foods Salted popcorn and pretzels. Onion salt, garlic salt, seasoned salt, table salt, and sea salt. Worcestershire sauce. Tartar sauce. Barbecue sauce. Teriyaki sauce. Soy sauce, including reduced-sodium. Steak sauce. Canned and packaged gravies. Fish sauce. Oyster sauce. Cocktail sauce. Horseradish that you find on the shelf. Ketchup. Mustard. Meat flavorings and tenderizers. Bouillon cubes. Hot sauce and Tabasco sauce. Premade or packaged marinades. Premade or packaged taco seasonings. Relishes. Regular salad dressings. Where to find more information:  National Heart, Lung, and Blood Institute: PopSteam.is  American Heart Association: www.heart.org Summary  The DASH eating plan is a healthy eating plan that has been shown to reduce high blood pressure (hypertension). It may also reduce your risk for type 2 diabetes, heart disease, and stroke.  With the DASH eating plan, you should limit salt (sodium) intake to 2,300 mg a  day. If you have hypertension, you may need to reduce your sodium intake to 1,500 mg a day.  When on the DASH eating plan, aim to eat more fresh fruits and vegetables, whole grains, lean proteins, low-fat dairy, and heart-healthy fats.  Work with your health care provider or diet and nutrition specialist (dietitian) to adjust your eating plan to your individual calorie needs. This information is not intended to replace advice given to you by your health care provider. Make sure you discuss any questions  you have with your health care provider. Document Revised: 10/23/2017 Document Reviewed: 11/03/2016 Elsevier Patient Education  2020 [employer].

## 2020-11-20 ENCOUNTER — Other Ambulatory Visit: Payer: Self-pay | Admitting: Family Medicine

## 2020-11-20 DIAGNOSIS — I10 Essential (primary) hypertension: Secondary | ICD-10-CM

## 2020-12-19 ENCOUNTER — Ambulatory Visit: Payer: BC Managed Care – PPO | Admitting: Family Medicine

## 2020-12-19 ENCOUNTER — Ambulatory Visit: Payer: Self-pay | Admitting: Family Medicine

## 2020-12-20 ENCOUNTER — Other Ambulatory Visit: Payer: Self-pay

## 2020-12-20 ENCOUNTER — Ambulatory Visit: Payer: BC Managed Care – PPO | Admitting: Family Medicine

## 2020-12-20 ENCOUNTER — Encounter: Payer: Self-pay | Admitting: Family Medicine

## 2020-12-20 VITALS — BP 120/80 | HR 86 | Wt 249.6 lb

## 2020-12-20 DIAGNOSIS — F1011 Alcohol abuse, in remission: Secondary | ICD-10-CM | POA: Insufficient documentation

## 2020-12-20 DIAGNOSIS — I10 Essential (primary) hypertension: Secondary | ICD-10-CM | POA: Diagnosis not present

## 2020-12-20 DIAGNOSIS — F419 Anxiety disorder, unspecified: Secondary | ICD-10-CM

## 2020-12-20 DIAGNOSIS — K219 Gastro-esophageal reflux disease without esophagitis: Secondary | ICD-10-CM

## 2020-12-20 DIAGNOSIS — R748 Abnormal levels of other serum enzymes: Secondary | ICD-10-CM | POA: Diagnosis not present

## 2020-12-20 NOTE — Progress Notes (Signed)
   Subjective:    Patient ID: Zachary Schultz, male    DOB: 05/09/1981, 40 y.o.   MRN: [id number]  HPI Chief Complaint  Patient presents with  . Hypertension    Follow-up on HTN. Bp is ok    He is here to follow-up on hypertension and elevated liver enzymes.  States he has not had any alcohol since his ED visit on Christmas day.  He has been going to [employer]  HTN- states he has been taking his HTN medication without any issues.   Reports having issues with GERD. Taking OTC medication for this.   States he talked to Dr. Evelene Croon, his psychiatrist, about anxiety. States she prescribed gabapentin for his anxiety but he has not needed it.   Denies fever, chills, dizziness, chest pain, palpitations, shortness of breath, abdominal pain, N/V/D, urinary symptoms, LE edema.   Reviewed allergies, medications, past medical, surgical, family, and social history.    Review of Systems Pertinent positives and negatives in the history of present illness.     Objective:   Physical Exam BP 120/80   Pulse 86   Wt 249 lb 9.6 oz (113.2 kg)   BMI 39.09 kg/m   Alert and oriented and in no acute distress. Respirations unlabored. Normal mood.       Assessment & Plan:  Primary hypertension - Plan: Comprehensive metabolic panel  Gastroesophageal reflux disease, unspecified whether esophagitis present  Anxiety  Elevated liver enzymes - Plan: Comprehensive metabolic panel  Alcohol abuse, in remission  Congratulated him on avoiding alcohol.  BP controlled. Continue medication and low sodium diet.  Continue to see Dr. Evelene Croon for anxiety.  I will check liver function and if still elevated, I will refer him to GI. Reviewed abdominal US which showed fatty liver and/or liver disease.

## 2020-12-21 ENCOUNTER — Encounter: Payer: Self-pay | Admitting: Family Medicine

## 2020-12-21 LAB — COMPREHENSIVE METABOLIC PANEL
ALT: 46 IU/L — ABNORMAL HIGH (ref 0–44)
AST: 28 IU/L (ref 0–40)
Albumin/Globulin Ratio: 1.8 (ref 1.2–2.2)
Albumin: 4.8 g/dL (ref 4.0–5.0)
Alkaline Phosphatase: 107 IU/L (ref 44–121)
BUN/Creatinine Ratio: 13 (ref 9–20)
BUN: 12 mg/dL (ref 6–20)
Bilirubin Total: 0.4 mg/dL (ref 0.0–1.2)
CO2: 23 mmol/L (ref 20–29)
Calcium: 9.7 mg/dL (ref 8.7–10.2)
Chloride: 100 mmol/L (ref 96–106)
Creatinine, Ser: 0.96 mg/dL (ref 0.76–1.27)
GFR calc Af Amer: 115 mL/min/{1.73_m2} (ref >59)
GFR calc non Af Amer: 99 mL/min/{1.73_m2} (ref >59)
Globulin, Total: 2.6 g/dL (ref 1.5–4.5)
Glucose: 115 mg/dL — ABNORMAL HIGH (ref 65–99)
Potassium: 4.2 mmol/L (ref 3.5–5.2)
Sodium: 139 mmol/L (ref 134–144)
Total Protein: 7.4 g/dL (ref 6.0–8.5)

## 2020-12-26 NOTE — Progress Notes (Signed)
He did not see his results or my note. Please call him.

## 2021-01-16 ENCOUNTER — Other Ambulatory Visit: Payer: Self-pay | Admitting: Family Medicine

## 2021-01-16 DIAGNOSIS — I10 Essential (primary) hypertension: Secondary | ICD-10-CM

## 2021-01-31 ENCOUNTER — Encounter: Payer: Self-pay | Admitting: Family Medicine

## 2021-01-31 ENCOUNTER — Ambulatory Visit: Payer: BC Managed Care – PPO | Admitting: Family Medicine

## 2021-01-31 ENCOUNTER — Other Ambulatory Visit: Payer: Self-pay

## 2021-01-31 VITALS — BP 142/92 | HR 80 | Temp 98.4°F | Wt 252.0 lb

## 2021-01-31 DIAGNOSIS — F419 Anxiety disorder, unspecified: Secondary | ICD-10-CM

## 2021-01-31 DIAGNOSIS — F439 Reaction to severe stress, unspecified: Secondary | ICD-10-CM

## 2021-01-31 DIAGNOSIS — I10 Essential (primary) hypertension: Secondary | ICD-10-CM | POA: Diagnosis not present

## 2021-01-31 NOTE — Progress Notes (Signed)
   Subjective:    Patient ID: Zachary Schultz, male    DOB: [birth year], 40 y.o.   MRN: [id number]  HPI Chief Complaint  Patient presents with  . Hypertension    149-115 per pt   He is here with concerns regarding elevated blood pressure.  Reports taking his medication daily.  States he has refrained from alcohol except for on 2 occasions since I last saw him. States he felt like he had a anxiety attack yesterday and earlier today he felt like another one was coming on.  States he has been taking gabapentin per his psychiatrist's recommendation.  States he feels that he is now taking this medication in place of drinking alcohol and wanting to take an extra pill to make him feel better.  He is concerned about this. He does not want to become addicted to anything else and is still working on trying to avoid alcohol.  Denies fever, chills, headache, dizziness, chest pain, palpitations, shortness of breath.   Review of Systems Pertinent positives and negatives in the history of present illness.     Objective:   Physical Exam BP (!) 142/92   Pulse 80   Temp 98.4 F (36.9 C)   Wt 252 lb (114.3 kg)   SpO2 97%   BMI 39.47 kg/m   Alert and in no distress. Cardiac exam shows a regular sinus rhythm without murmurs or gallops. Lungs are clear to auscultation.       Assessment & Plan:  Primary hypertension  Anxiety  Stress  Reassured him that his blood pressure was not dangerously elevated today. Discussed that his blood pressure is most likely elevated today due to anxiety and stress.  Encouraged him to call his psychiatrist when he leaves here to discuss switching his medication from gabapentin to something else.  I also discussed the importance of counseling for him.  Discussed that his addiction is a illness and to realize asking for help as a strength and not a weakness.

## 2021-02-15 IMAGING — CR DG SHOULDER 2+V*L*
3 series · 3 of 3 positions shown · non-contrast
Comparison: None.

CLINICAL DATA: Pain without trauma.

EXAM:
LEFT SHOULDER - 2+ VIEW

[w shoulder grashey left]
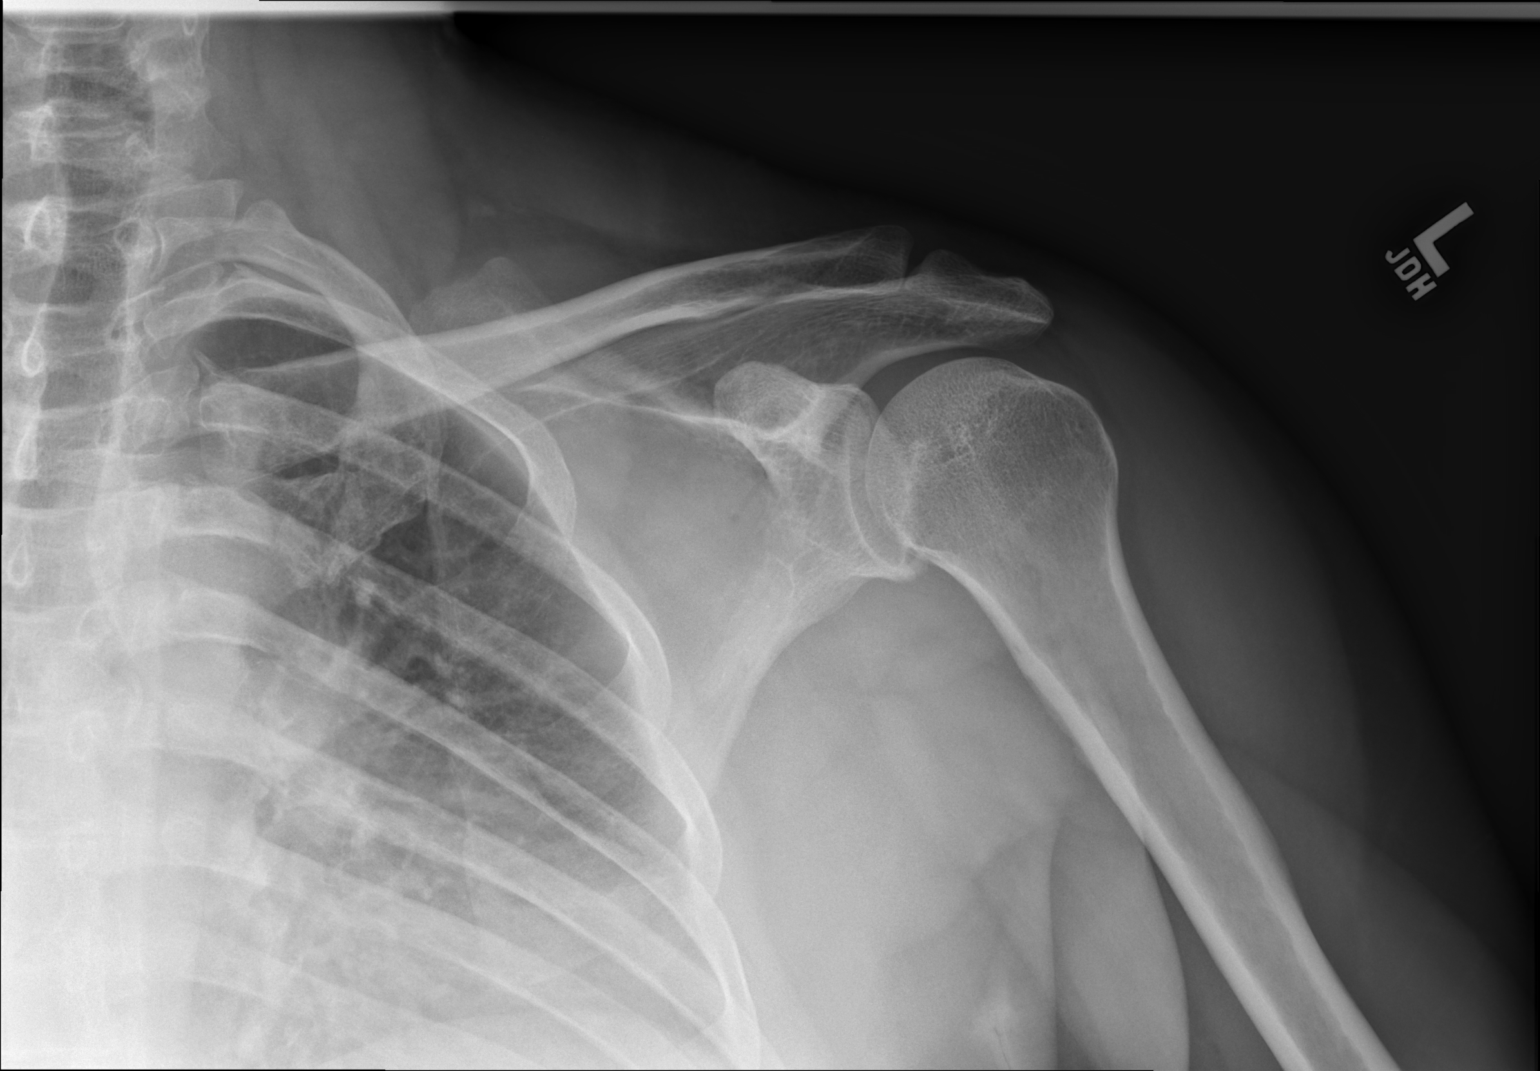

[w shoulder y-view left]
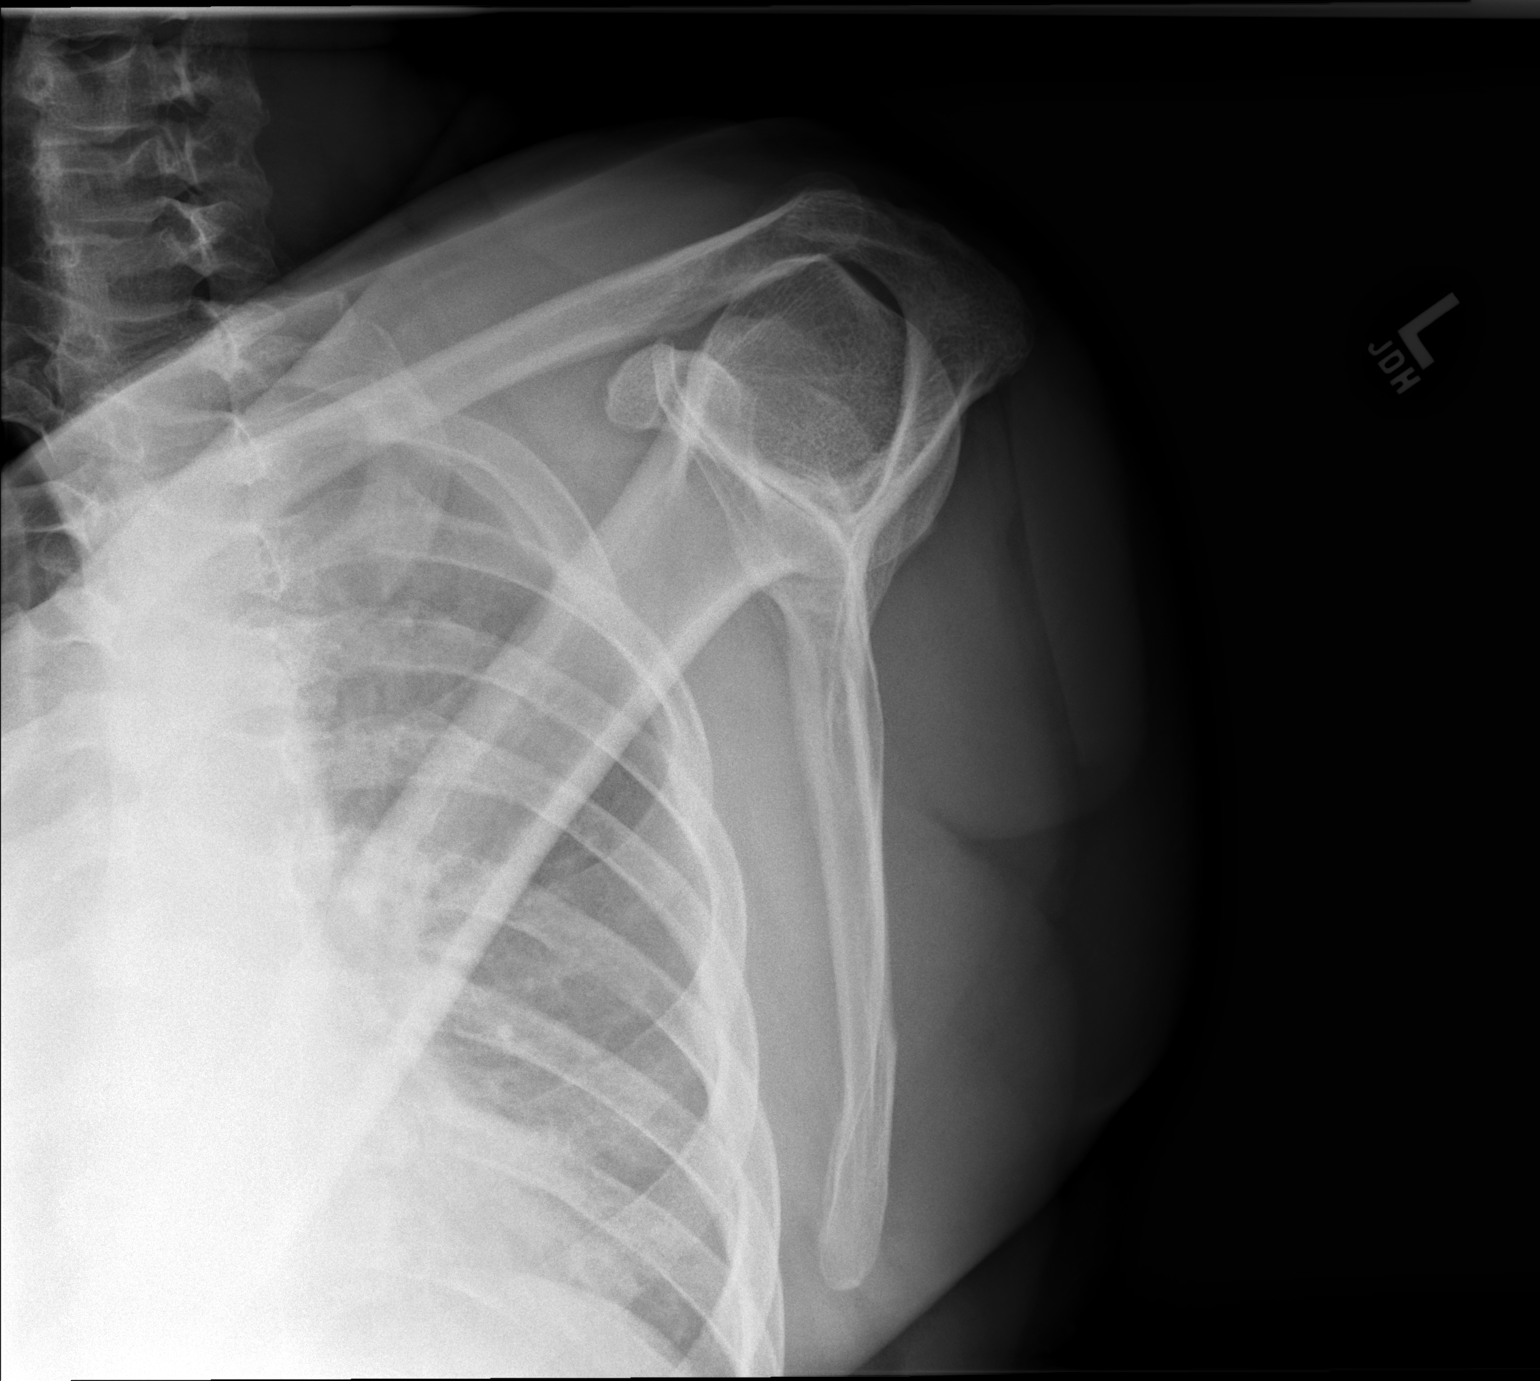

[w shoulder axillary left]
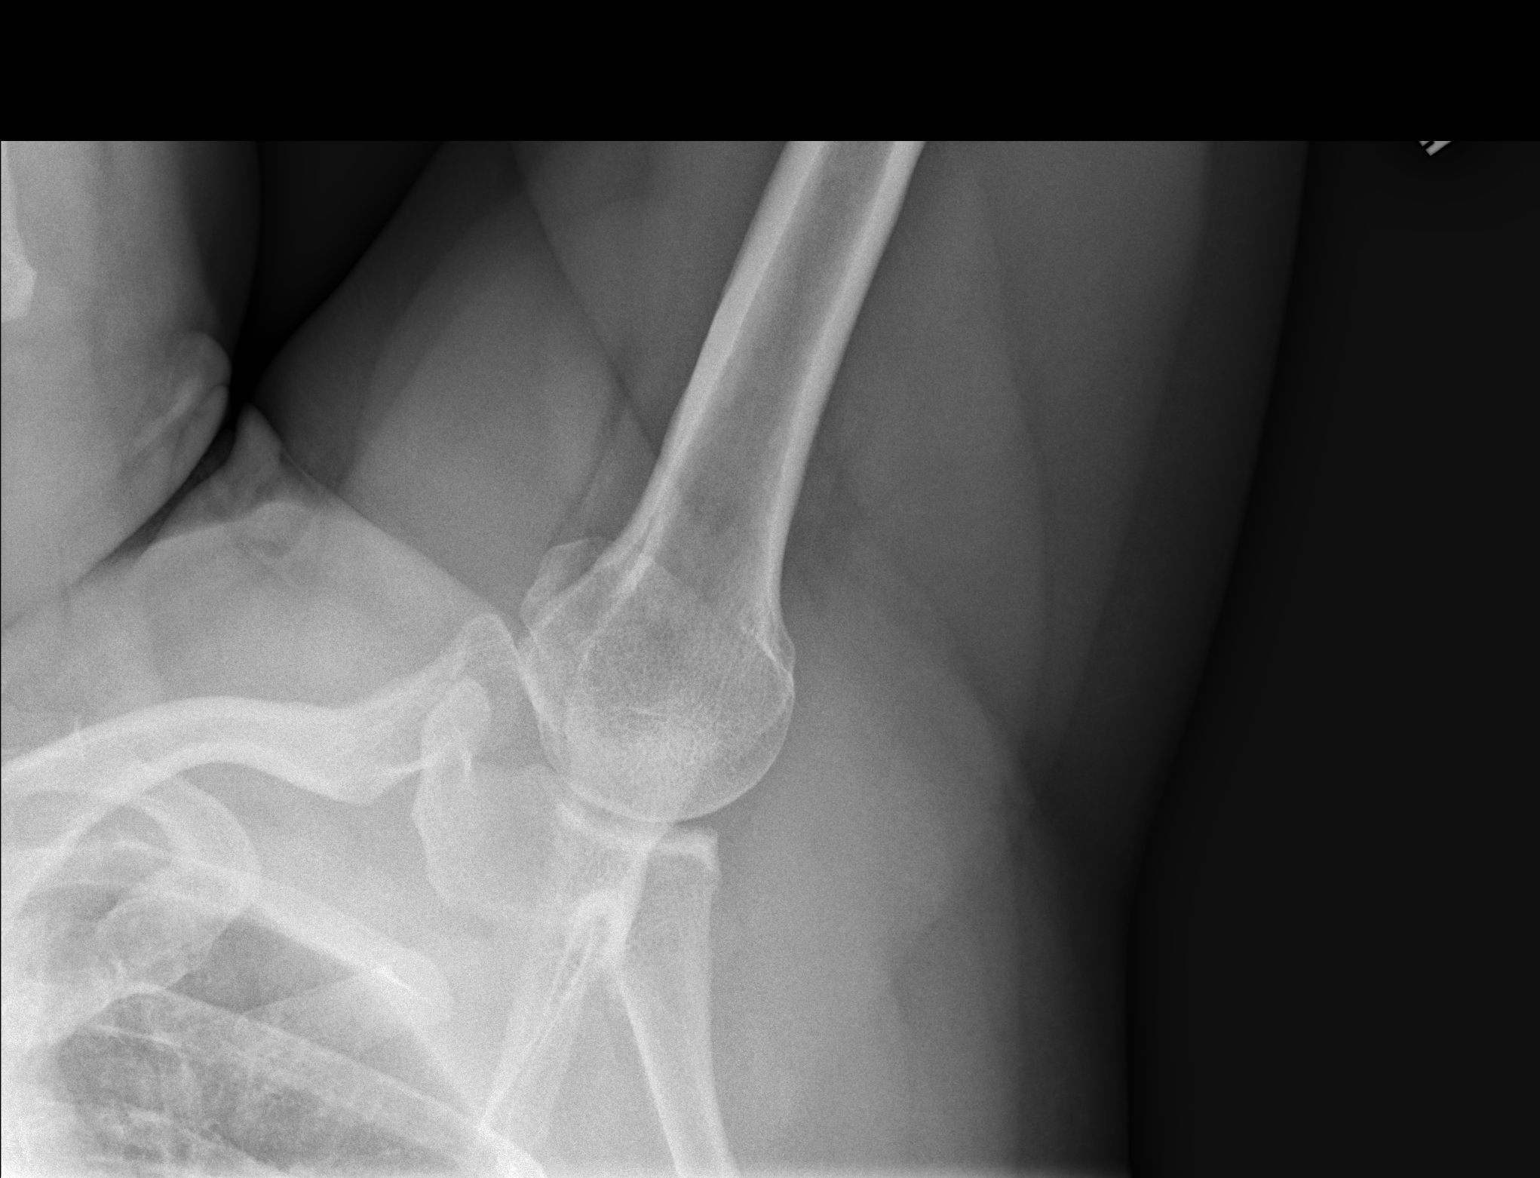

[3 of 3 positions shown; findings below may reference images not displayed]

FINDINGS: There is no evidence of fracture or dislocation. There is no
evidence of arthropathy or other focal bone abnormality. Soft
tissues are unremarkable.
IMPRESSION: Negative.

## 2021-07-01 ENCOUNTER — Telehealth: Payer: Self-pay | Admitting: Family Medicine

## 2021-07-01 NOTE — Telephone Encounter (Signed)
Pt called and states that he is in [place] and is experiencing a headache, shortness of breath and blurred vision in right eye. Pt was advised to seek medical attention in [place]. Sending to [employer] as FYI due to Roxana being out.

## 2021-08-22 ENCOUNTER — Encounter: Payer: BC Managed Care – PPO | Admitting: Medical

## 2021-09-09 ENCOUNTER — Ambulatory Visit: Payer: BC Managed Care – PPO | Admitting: Medical

## 2021-09-09 ENCOUNTER — Other Ambulatory Visit: Payer: Self-pay

## 2021-09-09 VITALS — BP 120/80 | HR 54 | Wt 262.2 lb

## 2021-09-09 DIAGNOSIS — T50905A Adverse effect of unspecified drugs, medicaments and biological substances, initial encounter: Secondary | ICD-10-CM | POA: Diagnosis not present

## 2021-09-09 DIAGNOSIS — F410 Panic disorder [episodic paroxysmal anxiety] without agoraphobia: Secondary | ICD-10-CM

## 2021-09-09 DIAGNOSIS — I10 Essential (primary) hypertension: Secondary | ICD-10-CM

## 2021-09-09 DIAGNOSIS — F419 Anxiety disorder, unspecified: Secondary | ICD-10-CM

## 2021-09-09 DIAGNOSIS — R0602 Shortness of breath: Secondary | ICD-10-CM

## 2021-09-09 DIAGNOSIS — K219 Gastro-esophageal reflux disease without esophagitis: Secondary | ICD-10-CM | POA: Diagnosis not present

## 2021-09-09 DIAGNOSIS — M791 Myalgia, unspecified site: Secondary | ICD-10-CM | POA: Insufficient documentation

## 2021-09-09 MED ORDER — LISINOPRIL-HYDROCHLOROTHIAZIDE 20-12.5 MG PO TABS: 1.0000 | ORAL_TABLET | Freq: Every day | ORAL | 1 refills | Status: DC

## 2021-09-09 MED ORDER — ALBUTEROL SULFATE HFA 108 (90 BASE) MCG/ACT IN AERS: 2.0000 | INHALATION_SPRAY | Freq: Four times a day (QID) | RESPIRATORY_TRACT | 1 refills | Status: AC | PRN

## 2021-09-09 MED ORDER — OMEPRAZOLE 20 MG PO CPDR: 20.0000 mg | DELAYED_RELEASE_CAPSULE | Freq: Every day | ORAL | 1 refills | Status: DC

## 2021-09-09 MED ORDER — AMPHETAMINE-DEXTROAMPHET ER 20 MG PO CP24: 20.0000 mg | ORAL_CAPSULE | Freq: Every morning | ORAL | 0 refills | Status: DC

## 2021-09-09 MED ORDER — HYDROXYZINE HCL 25 MG PO TABS: 25.0000 mg | ORAL_TABLET | Freq: Three times a day (TID) | ORAL | 2 refills | Status: DC | PRN

## 2021-09-09 NOTE — Progress Notes (Signed)
Subjective:  Zachary Schultz is a 40 y.o. male who presents for Chief Complaint  Patient presents with   med check    Med check- would like refill on omeprazole     Here for urgent care follow-up and medication refills.  He was seeing Hetty Blend, nurse practitioner here prior but she recently left  He was seen recently at urgent care for chest pain but after evaluation it was determined that this was more of an anxiety issue.  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  So the week he went to the urgent care he was going back and forth between [place] and [place], he has numerous cruise in several different foreman.  That same week 2 of his foreman were on vacation, he was having to cover extra shifts, he had some people out sick and he had some family visiting from [place] which made the whole week super stressful.  The week he had the symptoms he was having palpitations, shortness of breath.  The hospital prescribed hydroxyzine which he has been using with some good relief.  He feels scatterbrained at times.  He has been on ADD medicine in the past but has not been using it in the past year as he did not think he needed it.  He was on a statin but he was having a lot of myalgias.  When he stopped the statin her myalgias went away.  He has had some panic attacks in recent weeks.  However since the urgent care consult, he has gotten hooked up with a mentor/coach.  They talk every couple of days.  This person is coaching him and advising him from a business standpoint as well as to help with his stressors.  He has a history of heavier alcohol use in the past.  Particularly he drinks beer or tequila.  He says after talking with Vickie here prior he is cut back 80% on alcohol from a year ago  Nevertheless, he is having trouble losing weight.  He would like advice on losing weight.  He notes that he was diagnosed with  ADHD with Dr. Evelene Croon, psychiatry that he saw in the past.  His son has ADHD as well.  He denies depression or mood swings.  He loves life and wants to be a productive person.  He is always looking for ways to improve on his skills and production.  No other aggravating or relieving factors.    No other c/o.  Past Medical History:  Diagnosis Date   Anxiety 11/09/2018   Elevated blood pressure reading without diagnosis of hypertension 04/22/2018   Elevated triglycerides with high cholesterol    2014   Hyperlipidemia    Hypertension    OSA (obstructive sleep apnea) 02/27/2019   Stress 11/09/2018   Current Outpatient Medications on File Prior to Visit  Medication Sig Dispense Refill   atorvastatin (LIPITOR) 20 MG tablet Take 1 tablet by mouth once daily (Patient taking differently: Take 20 mg by mouth daily.) 90 tablet 1   acetaminophen (TYLENOL) 500 MG tablet Take 500 mg by mouth every 6 (six) hours as needed for moderate pain. (Patient not taking: Reported on 09/09/2021)     No current facility-administered medications on file prior to visit.     The following portions of the patient's history were reviewed and updated as appropriate: allergies, current medications, past family history, past medical history, past social  history, past surgical history and problem list.  ROS Otherwise as in subjective above  Objective: BP 120/80   Pulse (!) 54   Wt 262 lb 3.2 oz (118.9 kg)   BMI 41.07 kg/m   General appearance: alert, no distress, well developed, well nourished Psych: pleasant, good eye contact, answers questions appriopraly     Assessment: Encounter Diagnoses  Name Primary?   Essential hypertension, benign Yes   Gastroesophageal reflux disease, unspecified whether esophagitis present    Adverse effect of drug, initial encounter    Anxiety    Panic attack    Myalgia    Uncontrolled hypertension    Shortness of breath      Plan: Anxiety, panic attacks-I reviewed his  recent urgent care visit and evaluation.  Continue hydroxyzine as needed.  He was on sertraline prior but may have not done all that well on that medication.  Continue working with Tax inspector.  Recheck in 1 month  ADHD-restart stimulant which can likely be helpful.  We discussed though reducing stress when possible and trying to have is consistent of a schedule as possible  Hypertension-continue current medication  GERD-continue current medication, avoid triggers  Adverse effect of drug, and statin intolerance.  For now we will hold off on statin given all the other issues.  We will plan to revisit this at next visit  Myalgias likely due to statin.  Improved off statin.   Zachary Schultz was seen today for med check.  Diagnoses and all orders for this visit:  Essential hypertension, benign  Gastroesophageal reflux disease, unspecified whether esophagitis present  Adverse effect of drug, initial encounter  Anxiety  Panic attack  Myalgia  Uncontrolled hypertension -     lisinopril-hydrochlorothiazide (ZESTORETIC) 20-12.5 MG tablet; Take 1 tablet by mouth daily.  Shortness of breath -     albuterol (VENTOLIN HFA) 108 (90 Base) MCG/ACT inhaler; Inhale 2 puffs into the lungs every 6 (six) hours as needed for wheezing or shortness of breath.  Other orders -     hydrOXYzine (ATARAX/VISTARIL) 25 MG tablet; Take 1 tablet (25 mg total) by mouth every 8 (eight) hours as needed. -     omeprazole (PRILOSEC) 20 MG capsule; Take 1 capsule (20 mg total) by mouth daily. -     amphetamine-dextroamphetamine (ADDERALL XR) 20 MG 24 hr capsule; Take 1 capsule (20 mg total) by mouth every morning.   Follow up: 10mo

## 2021-09-10 ENCOUNTER — Encounter: Payer: BC Managed Care – PPO | Admitting: Medical

## 2021-10-09 ENCOUNTER — Encounter: Payer: Self-pay | Admitting: Medical

## 2021-10-09 ENCOUNTER — Other Ambulatory Visit: Payer: Self-pay

## 2021-10-09 ENCOUNTER — Ambulatory Visit: Payer: BC Managed Care – PPO | Admitting: Medical

## 2021-10-09 VITALS — BP 118/84 | HR 88 | Ht 67.0 in | Wt 259.1 lb

## 2021-10-09 DIAGNOSIS — F410 Panic disorder [episodic paroxysmal anxiety] without agoraphobia: Secondary | ICD-10-CM

## 2021-10-09 DIAGNOSIS — Z833 Family history of diabetes mellitus: Secondary | ICD-10-CM

## 2021-10-09 DIAGNOSIS — G4733 Obstructive sleep apnea (adult) (pediatric): Secondary | ICD-10-CM

## 2021-10-09 DIAGNOSIS — I10 Essential (primary) hypertension: Secondary | ICD-10-CM | POA: Diagnosis not present

## 2021-10-09 DIAGNOSIS — Z6841 Body Mass Index (BMI) 40.0 and over, adult: Secondary | ICD-10-CM

## 2021-10-09 DIAGNOSIS — R7303 Prediabetes: Secondary | ICD-10-CM

## 2021-10-09 DIAGNOSIS — F1020 Alcohol dependence, uncomplicated: Secondary | ICD-10-CM

## 2021-10-09 DIAGNOSIS — F419 Anxiety disorder, unspecified: Secondary | ICD-10-CM

## 2021-10-09 MED ORDER — AMPHETAMINE-DEXTROAMPHET ER 20 MG PO CP24: 20.0000 mg | ORAL_CAPSULE | Freq: Every morning | ORAL | 0 refills | Status: DC

## 2021-10-09 NOTE — Progress Notes (Signed)
Subjective:  Zachary Schultz is a 40 y.o. male who presents for Chief Complaint  Patient presents with   Anxiety    1 month follow up     Here for 1 month follow-up.  A month ago I saw him for several concerns.  He was having a lot of anxiety and panic attacks.  Since last visit he is doing better in this regard.  He is trying to reduce stress through different means that we talked about.    Attention deficit-he did restart back on Adderall and doing fine on this.  No reported side effects  Hypertension-compliant with medication without complaint.  No chest pain no palpitation no edema.  Last visit he had a concern about difficulty losing weight.  He like to talk about this further today.  He is active, trying to eat healthy but still not seeming to lose weight.  He has tried various methods in the past.  He went without alcohol for solid month or 2 in the past and still did not lose weight.  He notes that he does like to party with his friends and when he does he will drink somewhat heavy.  However recently he noticed that heavier alcohol use really aggravates his anxiety  OSA-on CPAP but does not like to use it.  He does use it but it aggravates him on his face.  No other aggravating or relieving factors.    No other c/o.  Past Medical History:  Diagnosis Date   Anxiety 11/09/2018   Elevated blood pressure reading without diagnosis of hypertension 04/22/2018   Elevated triglycerides with high cholesterol    2014   Hyperlipidemia    Hypertension    OSA (obstructive sleep apnea) 02/27/2019   Stress 11/09/2018   Current Outpatient Medications on File Prior to Visit  Medication Sig Dispense Refill   albuterol (VENTOLIN HFA) 108 (90 Base) MCG/ACT inhaler Inhale 2 puffs into the lungs every 6 (six) hours as needed for wheezing or shortness of breath. 8 g 1   aspirin 325 MG tablet Take 325 mg by mouth daily.     hydrOXYzine (ATARAX/VISTARIL) 25 MG tablet Take 1 tablet (25 mg total) by mouth  every 8 (eight) hours as needed. 45 tablet 2   lisinopril-hydrochlorothiazide (ZESTORETIC) 20-12.5 MG tablet Take 1 tablet by mouth daily. 90 tablet 1   omeprazole (PRILOSEC) 20 MG capsule Take 1 capsule (20 mg total) by mouth daily. 90 capsule 1   acetaminophen (TYLENOL) 500 MG tablet Take 500 mg by mouth every 6 (six) hours as needed for moderate pain. (Patient not taking: No sig reported)     atorvastatin (LIPITOR) 20 MG tablet Take 1 tablet by mouth once daily (Patient not taking: Reported on 10/09/2021) 90 tablet 1   No current facility-administered medications on file prior to visit.     The following portions of the patient's history were reviewed and updated as appropriate: allergies, current medications, past family history, past medical history, past social history, past surgical history and problem list.  ROS Otherwise as in subjective above  Objective: BP 118/84 (BP Location: Left Arm, Patient Position: Sitting)   Pulse 88   Ht 5\' 7"  (1.702 m)   Wt 259 lb 1.6 oz (117.5 kg)   SpO2 98%   BMI 40.58 kg/m   Wt Readings from Last 3 Encounters:  10/09/21 259 lb 1.6 oz (117.5 kg)  09/09/21 262 lb 3.2 oz (118.9 kg)  01/31/21 252 lb (114.3 kg)   BP Readings  from Last 3 Encounters:  10/09/21 118/84  09/09/21 120/80  01/31/21 (!) 142/92     General appearance: alert, no distress, well developed, well nourished Psych: pleasant, good eye contact, answers questions appropriately Lungs clear Heart rrr, normal s1s2, on murmurs     Assessment: Encounter Diagnoses  Name Primary?   Primary hypertension Yes   BMI 40.0-44.9, adult (HCC)    OSA (obstructive sleep apnea)    Family history of diabetes mellitus (DM)    Alcohol dependence, binge pattern (HCC)    Anxiety    Essential hypertension, benign    Panic attack    Prediabetes       Plan: Obesity, prediabetes, hypertension  Referral to [facility] loss study [facility]  If not approved for study, we will plan to  begin trial of Mounjaro or Ozempic assuming insurance coverage.  He is agreeable.   Nurse did demo on how to use these pen devices.  We discussed proper use of medication.  Counseled on diet, exercise, goalsetting, and he will work to do a [employer].  We will plan call report in 2 weeks follow-up in 30 to 45 days  Alcohol use, binging at times - he vows to cut back  OSA - continue CPAP  Anxiety, panic attacks-improved since last visit.  Limit aclohol.  Continue hydroxyzine as needed.  He was on sertraline prior but may have not done all that well on that medication.  Continue working with Tax inspector.   ADHD-doing fine on current medicaiton  Hypertension-continue current medication   Hurschel was seen today for anxiety.  Diagnoses and all orders for this visit:  Primary hypertension  BMI 40.0-44.9, adult (HCC)  OSA (obstructive sleep apnea)  Family history of diabetes mellitus (DM)  Alcohol dependence, binge pattern (HCC)  Anxiety  Essential hypertension, benign  Panic attack  Prediabetes  Other orders -     amphetamine-dextroamphetamine (ADDERALL XR) 20 MG 24 hr capsule; Take 1 capsule (20 mg total) by mouth every morning.   Follow up: 2-3 week call back, OV 45 days

## 2021-10-09 NOTE — Patient Instructions (Signed)
Recommendations: Exercise most days per week Limit alcohol!!! Cut out bread, pasta, rice and potatoes the next 30 days Limit yourself to 2 tortillas per meal Do eat fruits, vegetables, lean cuts of meat drinking mostly water  I am referring you to weight loss study     Sample meal plan  Breakfast You may eat 1 of the following Omelette, which can include a small amount of cheese, and vegetables such as peppers, mushrooms, small pieces of [place] or chicken Low sugar yogurt serving which can include some fruit such as berries Egg whites or hard boiled egg and meat (1-2 strips of bacon, or small piece of [place] sausage or [place] bacon)   Mid-morning snack 1 fruit serving such as one of the following: medium-sized apple medium-sized orange, Tangerine 1/2 banana  3/4 cup of fresh berries or frozen berries  A protein source such as one of the following: 8 almonds  small handful of walnuts or other nuts small piece of cheese, low sugar yogurt   Lunch A protein source such as 1 of the following: 1 serving of beans such as black beans, pinto beans, green beans, or edamame (soy beans) 1 meat serving such as 6 oz or deck of card size serving of fish, skinless chicken, or [place], either grilled or baked preferably.   You can use some pork or beef, but limit this compared to fish, chicken or [place] Vegetable - Half of your plate should be a non-starchy vegetables!  So avoid white potatoes and corn.  Otherwise, eat a large portion of vegetables. Avocado, cucumber, tomato, carrots, greens, lettuce, squash, okra, etc.  Vegetables can include salad with olive oil/vinaigrette dressing   Mid-afternoon snack 1 fruit serving such as one of the following: medium-sized apple medium-sized orange, Tangerine 1/2 banana  3/4 cup of fresh berries or frozen berries  A protein source such as one of the following: 8 almonds  small handful of walnuts or other nuts small piece of cheese, low  sugar yogurt   Dinner A protein source such as 1 of the following: 1 serving of beans such as black beans, pinto beans, green beans, or edamame (soy beans) 1 meat serving such as 6 oz or deck of card size serving of fish, skinless chicken, or [place], either grilled or baked preferably.   You can use some pork or beef, but limit this compared to fish, chicken or [place] Vegetable - Half of your plate should be a non-starchy vegetables!  So avoid white potatoes and corn.  Otherwise, eat a large portion of vegetables. Avocado, cucumber, tomato, carrots, greens, lettuce, squash, okra, etc.  Vegetables can include salad with olive oil/vinaigrette dressing   Beverages: Water Unsweet tea Home made juice with a juicer without sugar added other than small bit of honey or agave nectar Water with sugar free flavor such as [place]   AVOID.... For the time being I want you to cut out the following items completely: Soda, sweet tea, juice, beer or wine or alcohol ALL grains and breads including rice, pasta, bread, cereal Sweets such as cake, candy, pies, chips, cookies, chocolate

## 2021-11-04 ENCOUNTER — Ambulatory Visit: Payer: BC Managed Care – PPO | Admitting: Medical

## 2021-11-05 ENCOUNTER — Ambulatory Visit: Payer: BC Managed Care – PPO | Admitting: Medical

## 2021-11-07 ENCOUNTER — Ambulatory Visit: Payer: BC Managed Care – PPO | Admitting: Medical

## 2021-12-24 ENCOUNTER — Encounter: Payer: Self-pay | Admitting: Medical

## 2021-12-24 ENCOUNTER — Telehealth: Payer: Self-pay | Admitting: Family Medicine

## 2021-12-24 ENCOUNTER — Other Ambulatory Visit: Payer: Self-pay

## 2021-12-24 ENCOUNTER — Telehealth: Payer: BC Managed Care – PPO | Admitting: Medical

## 2021-12-24 VITALS — Ht 67.0 in | Wt 256.0 lb

## 2021-12-24 DIAGNOSIS — R110 Nausea: Secondary | ICD-10-CM

## 2021-12-24 DIAGNOSIS — Z789 Other specified health status: Secondary | ICD-10-CM

## 2021-12-24 DIAGNOSIS — R6883 Chills (without fever): Secondary | ICD-10-CM | POA: Diagnosis not present

## 2021-12-24 DIAGNOSIS — F1010 Alcohol abuse, uncomplicated: Secondary | ICD-10-CM

## 2021-12-24 DIAGNOSIS — R109 Unspecified abdominal pain: Secondary | ICD-10-CM | POA: Diagnosis not present

## 2021-12-24 DIAGNOSIS — R531 Weakness: Secondary | ICD-10-CM

## 2021-12-24 MED ORDER — ONDANSETRON HCL 4 MG PO TABS: 4.0000 mg | ORAL_TABLET | Freq: Three times a day (TID) | ORAL | 0 refills | Status: DC | PRN

## 2021-12-24 MED ORDER — OMEPRAZOLE 40 MG PO CPDR
40.0000 mg | DELAYED_RELEASE_CAPSULE | Freq: Every day | ORAL | 1 refills | Status: DC
Start: 2021-12-24 — End: 2022-05-05

## 2021-12-24 MED ORDER — HYDROXYZINE HCL 25 MG PO TABS: 25.0000 mg | ORAL_TABLET | Freq: Three times a day (TID) | ORAL | 2 refills | Status: AC | PRN

## 2021-12-24 NOTE — Patient Instructions (Addendum)
We discussed his symptoms and concerns, and possible causes.  I suspect that you may have some acute alcohol withdrawal symptoms.  After binge drinking alcohol for a solid week and then stopping abruptly yesterday, you have several symptoms that could suggest alcohol withdrawal.  It would be worthwhile to have vital signs such as blood pressure and pulse and temperature to give [place] more information.  If your blood pressure is quite high such as higher than 200 top number or higher than 120 bottom number then you may even need to go to the hospital.  Consider coming into our office for in person examination and labs.  You wanted to wait and see how you do over the course of today before coming and possibly tomorrow  Other potential considerations in addition to alcohol withdrawal would be pancreatitis, liver inflammation, gastritis of the stomach, or other infection.  Since you do not have vomiting and diarrhea and significant abdominal pain, likely not food poisoning.  I recommend clear fluids throughout the day such as water, soup broth, ice chips, Excedrin.  Consider little G2 Gatorade here and there.  Hydrate to the point your urine is clear to light yellow.  For uneasiness and withdrawal I do recommend you take your hydroxyzine 3 times a day for the next several days.  Double up on your omeprazole and take omeprazole 40 mg or 2 capsules daily for the next 1 to 2 weeks  You can begin Zofran for nausea.  I sent this to the pharmacy as well.  If possible hold off on taking of Adderall the next few days as this can worsen your symptoms  Continue your blood pressure medication lisinopril HCT.  Try to take it easy and rest today.  Do not drink more alcohol the next several days.  In general I would recommend limiting alcohol to 1 or less drinks on any given day.  Consider coming in person for evaluation as we discussed.  He would need to call back and let us know you are coming to put you on  the schedule for either labs or labs and in person evaluation

## 2021-12-24 NOTE — Telephone Encounter (Signed)
Pt called with blood pressure readings, he said you requested and he is feeling better    151/105  85   157/105

## 2021-12-24 NOTE — Progress Notes (Signed)
This visit type was conducted due to national recommendations for restrictions regarding the COVID-19 Pandemic (e.g. social distancing) in an effort to limit this patient's exposure and mitigate transmission in our community.  Due to their co-morbid illnesses, this patient is at least at moderate risk for complications without adequate follow up.  This format is felt to be most appropriate for this patient at this time.    Documentation for virtual audio and video telecommunications through [place] encounter:  The patient was located at home. The provider was located in the office. The patient did consent to this visit and is aware of possible charges through their insurance for this visit.  The other persons participating in this telemedicine service were none. Time spent on call was 20 minutes and in review of previous records >25 minutes total.  This virtual service is not related to other E/M service within previous 7 days.    Subjective:  Zachary Schultz is a 41 y.o. male who presents for Chief Complaint  Patient presents with   other    Chills, light head ache, eyes moving around, some chest pain, feel weak, chest felt tight, heart beat was fast, SOB, lt. Side pain, dry mouth      Virtual consult for not feeling well.  He notes feeling bad for 1 day.  He just got back from a trip to [place] yesterday.  He notes for the last week he and his friends went to [place] and drank a lot of tequila daily for the last 7 days.  He has not drank in the last 24 hours when they left.  He notes they partied pretty hard.  3 of his friends are feeling similar to him.  He notes feeling chills, lightheaded, weak, pressure behind the head on the right, feels a little jittery, felt like his blood pressure is elevated, palpitations.  Some nausea.  No vomiting.  No diarrhea or abdominal pain however he does note some pain on his left flank area which he is calling the kidney area.  No appetite in the last 24  hours.  Has not eaten much in the last 24 hours.  Feels sweats, throat feels irritated from talking so much.  No urinary complaint.  No blood in the urine or stool.  Could not sleep last night.  He is not sure if he drank some bad tequila or cheap tequlia causing these problems. No other aggravating or relieving factors.    No other c/o.  Past Medical History:  Diagnosis Date   Anxiety 11/09/2018   Elevated blood pressure reading without diagnosis of hypertension 04/22/2018   Elevated triglycerides with high cholesterol    2014   Hyperlipidemia    Hypertension    OSA (obstructive sleep apnea) 02/27/2019   Stress 11/09/2018   Current Outpatient Medications on File Prior to Visit  Medication Sig Dispense Refill   acetaminophen (TYLENOL) 500 MG tablet Take 500 mg by mouth every 6 (six) hours as needed for moderate pain.     albuterol (VENTOLIN HFA) 108 (90 Base) MCG/ACT inhaler Inhale 2 puffs into the lungs every 6 (six) hours as needed for wheezing or shortness of breath. 8 g 1   amphetamine-dextroamphetamine (ADDERALL XR) 20 MG 24 hr capsule Take 1 capsule (20 mg total) by mouth every morning. 30 capsule 0   aspirin 325 MG tablet Take 325 mg by mouth daily.     atorvastatin (LIPITOR) 20 MG tablet Take 1 tablet by mouth once daily 90 tablet 1  hydrOXYzine (ATARAX/VISTARIL) 25 MG tablet Take 1 tablet (25 mg total) by mouth every 8 (eight) hours as needed. 45 tablet 2   lisinopril-hydrochlorothiazide (ZESTORETIC) 20-12.5 MG tablet Take 1 tablet by mouth daily. 90 tablet 1   omeprazole (PRILOSEC) 20 MG capsule Take 1 capsule (20 mg total) by mouth daily. 90 capsule 1   No current facility-administered medications on file prior to visit.     The following portions of the patient's history were reviewed and updated as appropriate: allergies, current medications, past family history, past medical history, past social history, past surgical history and problem list.  ROS Otherwise as in  subjective above  Objective: Ht 5\' 7"  (1.702 m)    Wt 256 lb (116.1 kg)    BMI 40.10 kg/m   General appearance: alert, well developed, well nourished Seems a little anxious Psych: Pleasant, answers questions appropriately   Assessment: Encounter Diagnoses  Name Primary?   Chills Yes   Weakness    Left flank pain    Alcohol consumption binge drinking    Nausea    History of foreign travel      Plan: Discussed limitations of virtual consult   We discussed his symptoms and concerns, and possible causes.  I suspect that you may have some acute alcohol withdrawal symptoms.  After binge drinking alcohol for a solid week and then stopping abruptly yesterday, you have several symptoms that could suggest alcohol withdrawal.  It would be worthwhile to have vital signs such as blood pressure and pulse and temperature to give more information.  If your blood pressure is quite high such as higher than 200 top number or higher than 120 bottom number then you may even need to go to the hospital.  Consider coming into our office for in person examination and labs.  You wanted to wait and see how you do over the course of today before coming and possibly tomorrow  Other potential considerations in addition to alcohol withdrawal would be pancreatitis, liver inflammation, gastritis of the stomach, or other infection.  Since you do not have vomiting and diarrhea and significant abdominal pain, likely not food poisoning.  I recommend clear fluids throughout the day such as water, soup broth, ice chips, Excedrin.  Consider little G2 Gatorade here and there.  Hydrate to the point your urine is clear to light yellow.  For uneasiness and withdrawal I do recommend you take your hydroxyzine 3 times a day for the next several days.  Double up on your omeprazole and take omeprazole 40 mg or 2 capsules daily for the next 1 to 2 weeks  If possible hold off on taking of Adderall the next few days as  this can worsen your symptoms  Continue your blood pressure medication lisinopril HCT.  Try to take it easy and rest today.  Do not drink more alcohol the next several days.  In general I would recommend limiting alcohol to 1 or less drinks on any given day.  Consider coming in person for evaluation as we discussed.  He would need to call back and let us know you are coming to put you on the schedule for either labs or labs and in person evaluation    Zachary Schultz was seen today for other.  Diagnoses and all orders for this visit:  Chills  Weakness  Left flank pain  Alcohol consumption binge drinking  Nausea  History of foreign travel    Follow up: prn

## 2021-12-25 ENCOUNTER — Other Ambulatory Visit: Payer: Self-pay | Admitting: Medical

## 2021-12-25 NOTE — Telephone Encounter (Signed)
Called pt and pt states his hang over is gone but having stomach cramping and diarrhea. Feels like he is going to puke, nauseated and weak and some dizziness. No SOB.   BP was 147-103 and pulse 95

## 2021-12-25 NOTE — Telephone Encounter (Signed)
Pt was notified.  

## 2021-12-25 NOTE — Telephone Encounter (Signed)
Patient called back, advised you were with patient and will call back

## 2021-12-25 NOTE — Telephone Encounter (Signed)
Left message for pt to call me back 

## 2021-12-26 ENCOUNTER — Telehealth: Payer: Self-pay

## 2021-12-26 NOTE — Telephone Encounter (Signed)
Pt. Scheduled tomorrow morning 8:30 a.m.

## 2021-12-26 NOTE — Telephone Encounter (Signed)
Pt. Called stating he was told to call back in a few days to let you know how he is doing. He said he trouble focusing brian feels lost. He is still having anxiety that comes and goes causing him to have trouble sleeping. Just doesn't feel right.

## 2021-12-27 ENCOUNTER — Other Ambulatory Visit: Payer: BC Managed Care – PPO

## 2021-12-27 ENCOUNTER — Ambulatory Visit: Payer: BC Managed Care – PPO | Admitting: Medical

## 2021-12-27 ENCOUNTER — Other Ambulatory Visit: Payer: Self-pay

## 2021-12-27 VITALS — BP 122/80 | HR 95 | Temp 97.5°F | Wt 262.0 lb

## 2021-12-27 DIAGNOSIS — F419 Anxiety disorder, unspecified: Secondary | ICD-10-CM

## 2021-12-27 DIAGNOSIS — F1010 Alcohol abuse, uncomplicated: Secondary | ICD-10-CM

## 2021-12-27 DIAGNOSIS — F515 Nightmare disorder: Secondary | ICD-10-CM

## 2021-12-27 DIAGNOSIS — K219 Gastro-esophageal reflux disease without esophagitis: Secondary | ICD-10-CM

## 2021-12-27 DIAGNOSIS — G4733 Obstructive sleep apnea (adult) (pediatric): Secondary | ICD-10-CM

## 2021-12-27 DIAGNOSIS — I10 Essential (primary) hypertension: Secondary | ICD-10-CM

## 2021-12-27 DIAGNOSIS — F10932 Alcohol use, unspecified with withdrawal with perceptual disturbance: Secondary | ICD-10-CM

## 2021-12-27 DIAGNOSIS — R110 Nausea: Secondary | ICD-10-CM

## 2021-12-27 MED ORDER — FOLIC ACID 1 MG PO TABS: 1.0000 mg | ORAL_TABLET | Freq: Every day | ORAL | 0 refills | Status: AC

## 2021-12-27 MED ORDER — AMPHETAMINE-DEXTROAMPHET ER 20 MG PO CP24: 20.0000 mg | ORAL_CAPSULE | Freq: Every morning | ORAL | 0 refills | Status: AC

## 2021-12-27 MED ORDER — THIAMINE HCL 100 MG PO TABS: 100.0000 mg | ORAL_TABLET | Freq: Every day | ORAL | 0 refills | Status: AC

## 2021-12-27 MED ORDER — VITAMIN B-12 1000 MCG PO TABS: 1000.0000 ug | ORAL_TABLET | Freq: Every day | ORAL | 0 refills | Status: AC

## 2021-12-27 MED ORDER — ONDANSETRON HCL 4 MG PO TABS: 4.0000 mg | ORAL_TABLET | Freq: Three times a day (TID) | ORAL | 0 refills | Status: AC | PRN

## 2021-12-27 NOTE — Patient Instructions (Signed)
Recommendations: I recommend you continue hydroxyzine twice daily through the weekend, and maybe use a 3rd tablet at bedtime until Monday. I recommend you continue omeprazole 40 mg daily for the next 1 to 2 weeks to reduce the risk of gastritis or acid reflux Continue your blood pressure medicine lisinopril HCT You can use Zofran Ondansetron for nausea as needed every 6-8 hours You can use your Adderall if needed to help with focus.  However if you are not working today or tomorrow I would maybe hold off on this another few days I recommend taking some short-term vitamins for the next month. I will send these to the pharmacy including vitamin B12, folate and thiamine.  You can stop these after 2 weeks if you are feeling back to normal.  We use these vitamins sometimes when someone has done some heavy alcohol use In general I recommend you limit alcohol on a regular basis.  Avoid binge drinking.  There is no safe amount of alcohol. Continue to hydrate well throughout the weekend  I called [facility] and they will get me your labs when they are resulted, probably Monday  or Tuesday.  We held off on labs today since you just had this large panel of labs and you are feeling significantly improved    Alcohol Withdrawal Syndrome When a person who drinks a lot of alcohol stops drinking, he or she may have unpleasant and serious symptoms. These symptoms are called alcohol withdrawal syndrome. This condition may be mild or severe. It can be life-threatening. This condition can cause: Shaking that you cannot control (tremor). Sweating. Headache. Feeling fearful, upset, grouchy, or depressed. Trouble sleeping (insomnia). Nightmares. Fast or uneven heartbeats (palpitations). Alcohol cravings. Feeling sick to your stomach (nausea). Throwing up (vomiting). Being bothered by light and sounds. Confusion. Trouble thinking clearly. Not being hungry (loss of appetite). Big changes in mood (mood  swings). If you have all of the following symptoms at the same time, get help right away: High blood pressure. Fast heartbeat. Trouble breathing. Seizures. Seeing, hearing, feeling, smelling, or tasting things that are not there (hallucinations). These symptoms are known as delirium tremens (DTs). They must be treated at the hospital right away. Follow these instructions at home:  Take over-the-counter and prescription medicines only as told by your doctor. This includes vitamins. Do not drink alcohol. Do not drive until your doctor says that this is safe for you. Have someone stay with you or be available in case you need help. This should be someone you trust. This person can help you with your symptoms. He or she can also help you to not drink. Drink enough fluid to keep your pee (urine) pale yellow. Think about joining a support group or a treatment program to help you stop drinking. Keep all follow-up visits as told by your doctor. This is important. Contact a doctor if: Your symptoms get worse. You cannot eat or drink without throwing up. You have a hard time not drinking alcohol. You cannot stop drinking alcohol. Get help right away if: You have fast or uneven heartbeats. You have chest pain. You have trouble breathing. You have a seizure for the first time. You see, hear, feel, smell, or taste something that is not there. You get very confused. Summary When a person who drinks a lot of alcohol stops drinking, he or she may have serious symptoms. This is called alcohol withdrawal syndrome. Delirium tremens (DTs) is a group of life-threatening symptoms. You should get help right away  if you have these symptoms. Think about joining an alcohol support group or a treatment program. This information is not intended to replace advice given to you by your health care provider. Make sure you discuss any questions you have with your health care provider. Document Revised: 10/01/2020  Document Reviewed: 10/01/2020 Elsevier Patient Education  2022 [employer].

## 2021-12-27 NOTE — Progress Notes (Signed)
Subjective:  Zachary Schultz is a 41 y.o. male who presents for Chief Complaint  Patient presents with   feeling better from [place]    Feeling better since [place]. No more hang out feeling, or stomach pain. Sometimes still has nauseated. Sometimes feeling confused still, still can't focus some     This is a follow up from virtual consult 12/24/21.    Zachary Schultz has a history of anxiety, sleep apnea, hypertension, hyperlipidemia, GERD, attention and focus issues, stressful job, and heavy alcohol use.   On 1/31 virtual visit he noted feeling bad for 1 day.  He had just got back from a trip to [place] on 12/23/21.  He notes for the last week he and his friends went to [place] and drank a lot of tequila daily for the 7 days prior him coming home.  He had not drank in the last 24 hours when they left.  He notes they partied pretty hard. 3 of his friends were feeling similar to him.    On 12/24/21 he noted feeling chills, lightheaded, weak, pressure behind the head on the right, feels a little jittery, felt like his blood pressure is elevated, palpitations.  Some nausea.  No vomiting.  No diarrhea or abdominal pain however he does note some pain on his left flank area which he is calling the kidney area.  No appetite in the last 24 hours.  Has not eaten much in the last 24 hours.  Feels sweats, throat feels irritated from talking so much.  No urinary complaint.  No blood in the urine or stool.  Could not sleep last night.  He is not sure if he drank some bad tequila or cheap tequlia causing these problems.  Today feeling improved, but still some nausea, some stomach ache, some confusion.  Eyes are blurry, feels off with decision making.  Hasn't taken adderall the last 3 days.  Been using the Hydroxyzine and Omeprazole.   Felt anxious Tuesday and Wednesday, but yesterday and today so far improving.  Feels a little jerky in the sleep.  Having some nightmares the last few days.    Hasn't used his CPAP this week.    No  blood in stool.  No BM the last 2 days, but good BM this morning.  He went yesterday to establish care with [facility] drug study regarding weight loss.  They took a lot of blood yesterday including tests for CMET, CBC, HgbA1, B12, folate, iron, lipid, TSH.   Thye wont have results back for 2-3 days.   No other aggravating or relieving factors.    No other c/o.  Past Medical History:  Diagnosis Date   Anxiety 11/09/2018   Elevated blood pressure reading without diagnosis of hypertension 04/22/2018   Elevated triglycerides with high cholesterol    2014   Hyperlipidemia    Hypertension    OSA (obstructive sleep apnea) 02/27/2019   Stress 11/09/2018   Current Outpatient Medications on File Prior to Visit  Medication Sig Dispense Refill   acetaminophen (TYLENOL) 500 MG tablet Take 500 mg by mouth every 6 (six) hours as needed for moderate pain.     albuterol (VENTOLIN HFA) 108 (90 Base) MCG/ACT inhaler Inhale 2 puffs into the lungs every 6 (six) hours as needed for wheezing or shortness of breath. 8 g 1   atorvastatin (LIPITOR) 20 MG tablet Take 1 tablet by mouth once daily 90 tablet 1   hydrOXYzine (ATARAX) 25 MG tablet Take 1 tablet (25 mg total)  by mouth every 8 (eight) hours as needed. 45 tablet 2   lisinopril-hydrochlorothiazide (ZESTORETIC) 20-12.5 MG tablet Take 1 tablet by mouth daily. 90 tablet 1   omeprazole (PRILOSEC) 40 MG capsule Take 1 capsule (40 mg total) by mouth daily. 90 capsule 1   aspirin 325 MG tablet Take 325 mg by mouth daily. (Patient not taking: Reported on 12/27/2021)     No current facility-administered medications on file prior to visit.     The following portions of the patient's history were reviewed and updated as appropriate: allergies, current medications, past family history, past medical history, past social history, past surgical history and problem list.  ROS Otherwise as in subjective above   Objective: BP 122/80    Pulse 95    Temp (!) 97.5 F  (36.4 C)    Wt 262 lb (118.8 kg)    SpO2 97%    BMI 41.04 kg/m   Wt Readings from Last 3 Encounters:  12/27/21 262 lb (118.8 kg)  12/24/21 256 lb (116.1 kg)  10/09/21 259 lb 1.6 oz (117.5 kg)   General appearence: alert, no distress, WD/WN,  HEENT: normocephalic, sclerae anicteric, PERRLA, EOMi, nares patent, no discharge or erythema, pharynx normal Oral cavity: MMM, no lesions Neck: supple, no lymphadenopathy, no thyromegaly, no masses Heart: RRR, normal S1, S2, no murmurs Lungs: CTA bilaterally, no wheezes, rhonchi, or rales Abdomen: +bs, soft, non tender, non distended, no masses, no hepatomegaly, no splenomegaly Extremities: no edema, no cyanosis, no clubbing Pulses: 2+ symmetric, upper and lower extremities, normal cap refill Neurological: alert, oriented x 4, CN2-12 intact, no asterixis, strength normal upper extremities and lower extremities, sensation normal throughout, DTRs 2+ throughout, no cerebellar signs, gait normal Psychiatric: normal affect, behavior normal, pleasant     Assessment: Encounter Diagnoses  Name Primary?   Nausea Yes   Alcohol withdrawal syndrome with perceptual disturbance (HCC)    Elevated blood pressure reading with diagnosis of hypertension    Alcohol consumption binge drinking    Anxiety    Essential hypertension, benign    Gastroesophageal reflux disease, unspecified whether esophagitis present    OSA (obstructive sleep apnea)    Nightmare       Plan: He is improving from virtual consult earlier in the week, vitals ok, exam ok, cognition seems ok.   Alcohol withdrawal seems the most likely diagnosis.    We discussed the following recommendations which I printed for him as well:   Your vital signs are improved today and your symptoms are improving thankfully  Recommendations: I recommend you continue hydroxyzine twice daily through the weekend, and maybe use a 3rd tablet at bedtime until Monday. I recommend you continue omeprazole 40  mg daily for the next 1 to 2 weeks to reduce the risk of gastritis or acid reflux Continue your blood pressure medicine lisinopril HCT You can use Zofran Ondansetron for nausea as needed every 6-8 hours You can use your Adderall if needed to help with focus.  However if you are not working today or tomorrow I would maybe hold off on this another few days I recommend taking some short-term vitamins for the next month. I will send these to the pharmacy including vitamin B12, folate and thiamine.  You can stop these after 2 weeks if you are feeling back to normal.  We use these vitamins sometimes when someone has done some heavy alcohol use In general I recommend you limit alcohol on a regular basis.  Avoid binge drinking.  There  is no safe amount of alcohol. Continue to hydrate well throughout the weekend  I called [facility] and they will get me your labs when they are resulted, probably Monday  or Tuesday.  We held off on labs today since you just had this large panel of labs and you are feeling significantly improved   Elita QuickJose was seen today for feeling better from [place].  Diagnoses and all orders for this visit:  Nausea  Alcohol withdrawal syndrome with perceptual disturbance (HCC)  Elevated blood pressure reading with diagnosis of hypertension  Alcohol consumption binge drinking  Anxiety  Essential hypertension, benign  Gastroesophageal reflux disease, unspecified whether esophagitis present  OSA (obstructive sleep apnea)  Nightmare  Other orders -     folic acid (FOLVITE) 1 MG tablet; Take 1 tablet (1 mg total) by mouth daily. -     vitamin B-12 (CYANOCOBALAMIN) 1000 MCG tablet; Take 1 tablet (1,000 mcg total) by mouth daily. -     thiamine 100 MG tablet; Take 1 tablet (100 mg total) by mouth daily. -     ondansetron (ZOFRAN) 4 MG tablet; Take 1 tablet (4 mg total) by mouth every 8 (eight) hours as needed for nausea or vomiting. -     amphetamine-dextroamphetamine (ADDERALL  XR) 20 MG 24 hr capsule; Take 1 capsule (20 mg total) by mouth every morning.     Follow up: pending lab results

## 2022-05-05 ENCOUNTER — Ambulatory Visit: Payer: BC Managed Care – PPO | Admitting: Family Medicine

## 2022-05-05 ENCOUNTER — Encounter: Payer: Self-pay | Admitting: Family Medicine

## 2022-05-05 ENCOUNTER — Other Ambulatory Visit: Payer: Self-pay

## 2022-05-05 VITALS — BP 152/96 | HR 90 | Temp 97.9°F | Wt 248.0 lb

## 2022-05-05 DIAGNOSIS — M542 Cervicalgia: Secondary | ICD-10-CM | POA: Diagnosis not present

## 2022-05-05 DIAGNOSIS — I10 Essential (primary) hypertension: Secondary | ICD-10-CM

## 2022-05-05 MED ORDER — CARISOPRODOL 350 MG PO TABS: 350.0000 mg | ORAL_TABLET | Freq: Four times a day (QID) | ORAL | 0 refills | Status: AC | PRN

## 2022-05-05 MED ORDER — LISINOPRIL-HYDROCHLOROTHIAZIDE 20-12.5 MG PO TABS: 1.0000 | ORAL_TABLET | Freq: Every day | ORAL | 0 refills | Status: DC

## 2022-05-05 MED ORDER — OMEPRAZOLE 40 MG PO CPDR: 40.0000 mg | DELAYED_RELEASE_CAPSULE | Freq: Every day | ORAL | 0 refills | Status: AC

## 2022-05-05 NOTE — Patient Instructions (Addendum)
Heat for 20 minutes 3 times per day with gentle stretching after the heat.  2 extra strength Tylenol 3 times per day and you can also take 2 Aleve twice per day.  Muscle relaxer at night.  You can continue to work but if something hurts do not do it

## 2022-05-05 NOTE — Progress Notes (Signed)
   Subjective:    Patient ID: Zachary Schultz, male    DOB: Aug 19, 1981, 41 y.o.   MRN: [id number]  HPI He states that he woke up Thursday morning with right-sided neck pain.  It is worse when he rotates his head or left or right.  Flexion and extension is less bothersome.  No numbness, tingling, weakness.  He has not tried any medications for this.   Review of Systems     Objective:   Physical Exam Alert and complaining of neck pain.  Tender to palpation to the right posterolateral cervical muscles.  Normal motor, sensory and DTRs.  Pain on rotation of the neck.       Assessment & Plan:  Neck pain - Plan: carisoprodol (SOMA) 350 MG tablet Heat for 20 minutes 3 times per day with gentle stretching after the heat.  2 extra strength Tylenol 3 times per day and you can also take 2 Aleve twice per day.  Muscle relaxer at night.  You can continue to work but if something hurts do not do it He will return here if he continues have difficulty.

## 2022-05-20 ENCOUNTER — Encounter: Payer: Self-pay | Admitting: Physician Assistant

## 2022-05-20 DIAGNOSIS — Z0000 Encounter for general adult medical examination without abnormal findings: Secondary | ICD-10-CM

## 2022-05-23 ENCOUNTER — Encounter: Payer: Self-pay | Admitting: Internal Medicine

## 2022-05-23 ENCOUNTER — Encounter: Payer: Self-pay | Admitting: Physician Assistant

## 2022-06-11 ENCOUNTER — Other Ambulatory Visit: Payer: Self-pay

## 2022-06-11 DIAGNOSIS — I10 Essential (primary) hypertension: Secondary | ICD-10-CM

## 2022-06-11 MED ORDER — LISINOPRIL-HYDROCHLOROTHIAZIDE 20-12.5 MG PO TABS: 1.0000 | ORAL_TABLET | Freq: Every day | ORAL | 1 refills | Status: AC

## 2022-06-17 ENCOUNTER — Encounter: Payer: Self-pay | Admitting: Family Medicine

## 2022-07-14 ENCOUNTER — Encounter: Payer: Self-pay | Admitting: Family Medicine

## 2022-07-15 ENCOUNTER — Encounter: Payer: BC Managed Care – PPO | Admitting: Medical

## 2022-07-30 ENCOUNTER — Encounter: Payer: Self-pay | Admitting: Internal Medicine

## 2022-08-12 IMAGING — CR DG CHEST 2V
2 series · 2 of 2 positions shown · non-contrast
Comparison: None.

CLINICAL DATA: Shortness of breath and XBDMH-CC positivity

EXAM:
CHEST - 2 VIEW

[w chest pa]
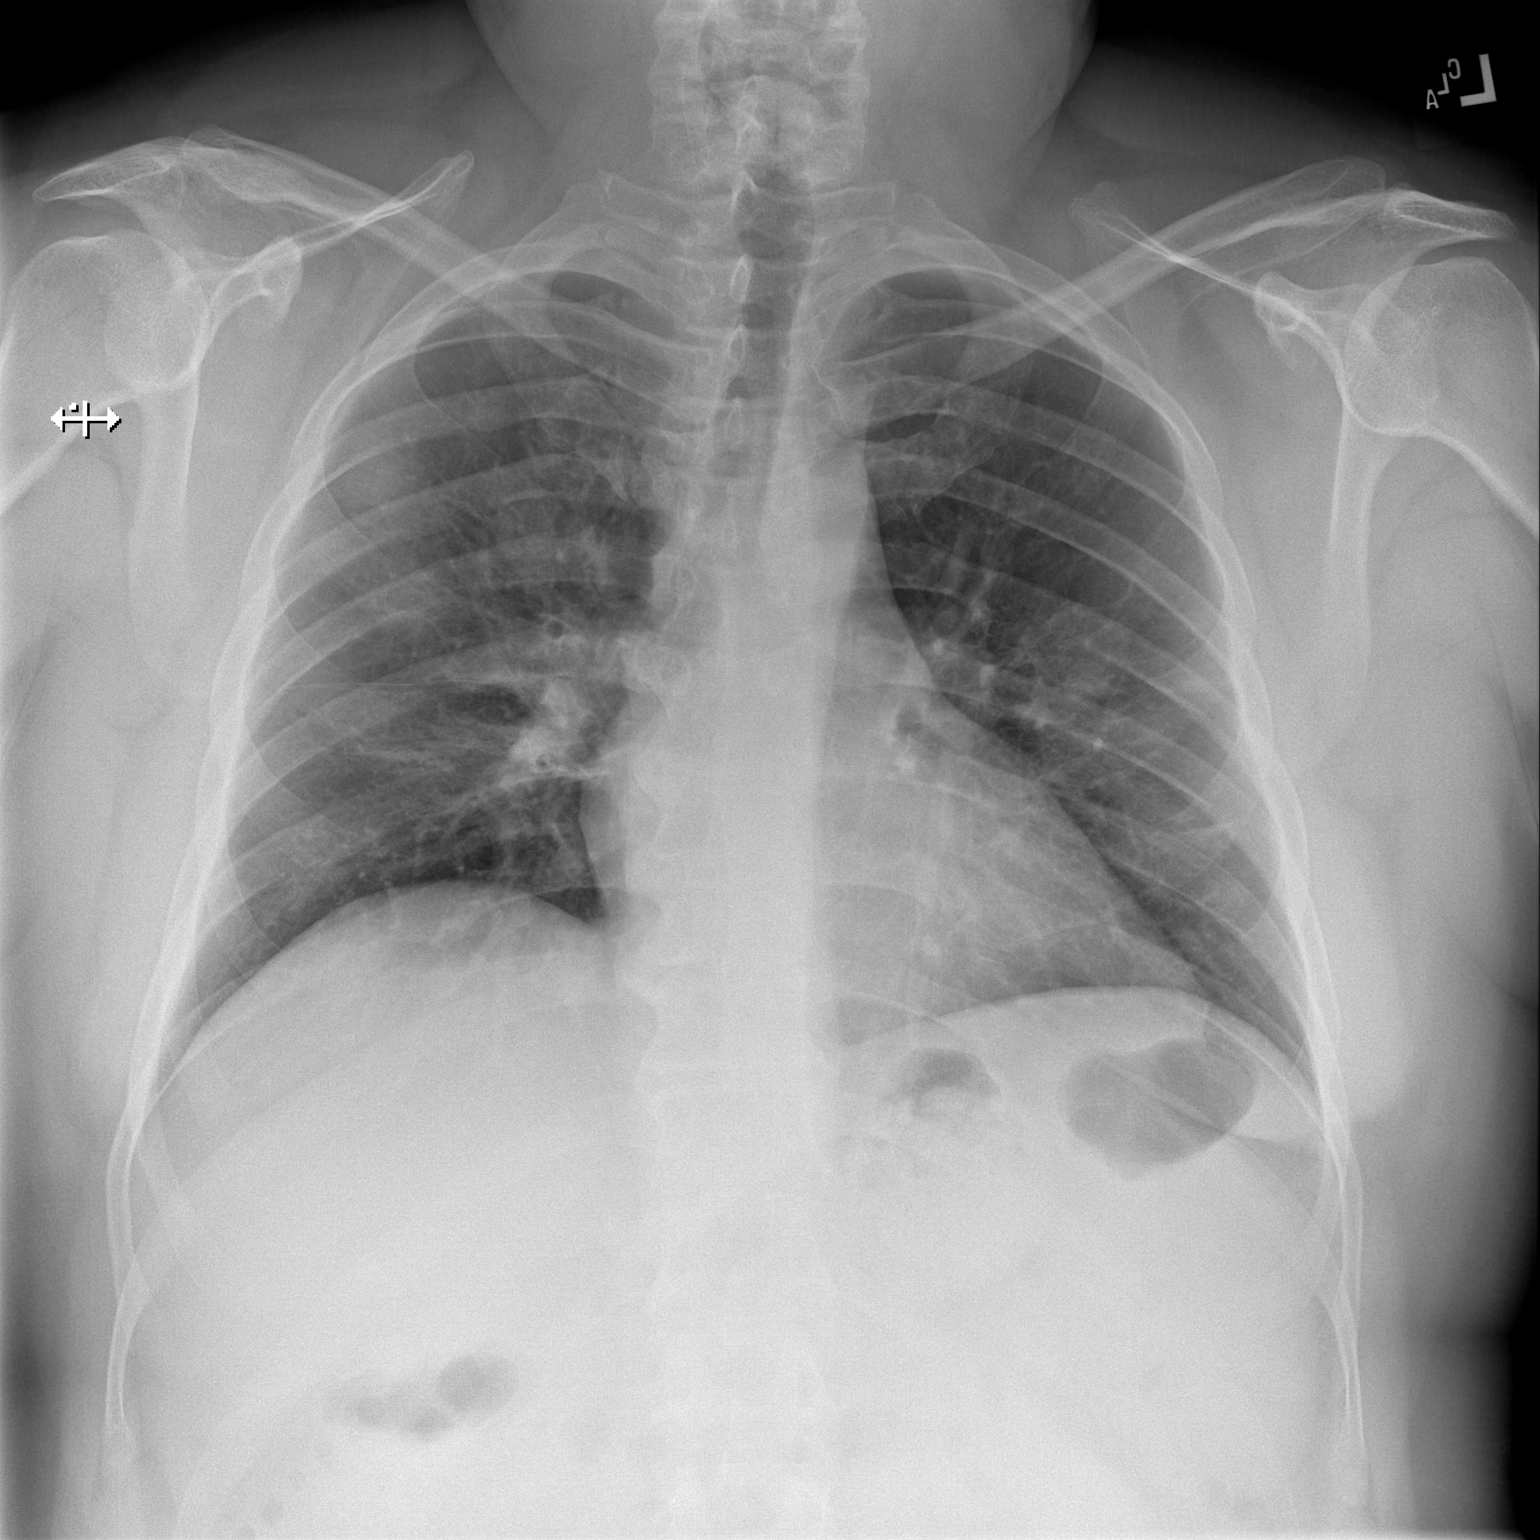

[w chest lat]
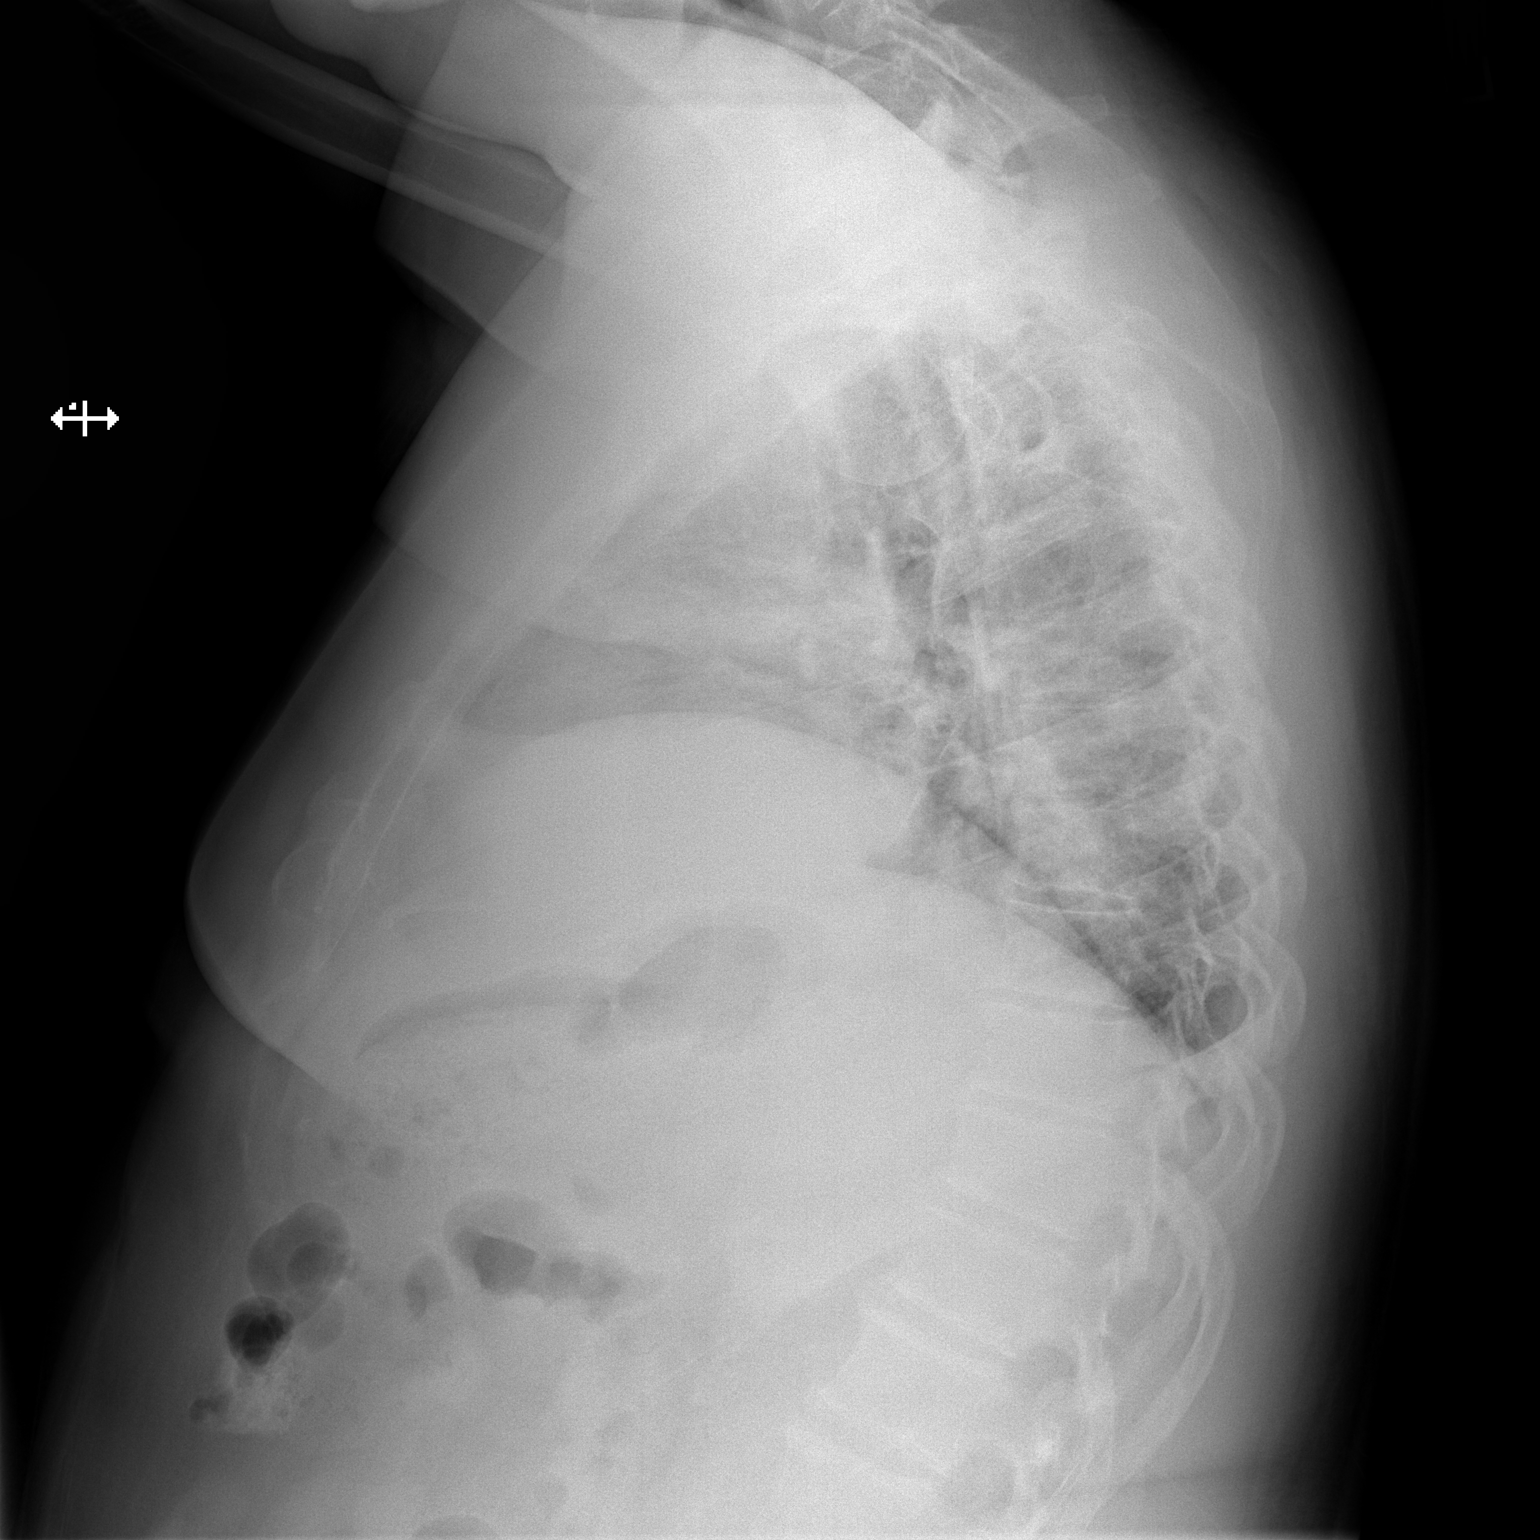

[2 of 2 positions shown; findings below may reference images not displayed]

FINDINGS: Cardiac shadow is within normal limits. Patchy opacities are noted
in both lungs consistent with the given clinical history. No sizable
effusion is noted. No bony abnormality is noted.
IMPRESSION: Patchy opacities bilaterally consistent with the given history.

## 2022-09-02 ENCOUNTER — Encounter: Payer: Self-pay | Admitting: Internal Medicine

## 2024-01-26 ENCOUNTER — Ambulatory Visit: Payer: Self-pay | Admitting: Family Medicine

## 2024-04-05 ENCOUNTER — Ambulatory Visit: Admitting: Family Medicine
